# Patient Record
Sex: Female | Born: 1945 | Race: White | Hispanic: No | Marital: Married | Smoking: Current every day smoker
Health system: Southern US, Community
[De-identification: ages and names within clinical notes are randomized; demographics above are authoritative.]

## PROBLEM LIST (undated history)

## (undated) DIAGNOSIS — K219 Gastro-esophageal reflux disease without esophagitis: Secondary | ICD-10-CM

## (undated) DIAGNOSIS — R609 Edema, unspecified: Secondary | ICD-10-CM

## (undated) DIAGNOSIS — E785 Hyperlipidemia, unspecified: Secondary | ICD-10-CM

## (undated) DIAGNOSIS — F419 Anxiety disorder, unspecified: Secondary | ICD-10-CM

## (undated) DIAGNOSIS — K449 Diaphragmatic hernia without obstruction or gangrene: Secondary | ICD-10-CM

## (undated) DIAGNOSIS — K5090 Crohn's disease, unspecified, without complications: Secondary | ICD-10-CM

## (undated) DIAGNOSIS — M5412 Radiculopathy, cervical region: Secondary | ICD-10-CM

## (undated) DIAGNOSIS — I10 Essential (primary) hypertension: Secondary | ICD-10-CM

## (undated) HISTORY — DX: Radiculopathy, cervical region: M54.12

## (undated) HISTORY — DX: Diaphragmatic hernia without obstruction or gangrene: K44.9

## (undated) HISTORY — DX: Gastro-esophageal reflux disease without esophagitis: K21.9

## (undated) HISTORY — PX: BLADDER SURGERY: SHX569

## (undated) HISTORY — PX: BREAST BIOPSY: SHX20

## (undated) HISTORY — PX: HEMORROIDECTOMY: SUR656

## (undated) HISTORY — DX: Hyperlipidemia, unspecified: E78.5

## (undated) HISTORY — PX: OTHER SURGICAL HISTORY: SHX169

## (undated) HISTORY — PX: CHOLECYSTECTOMY: SHX55

## (undated) HISTORY — PX: BACK SURGERY: SHX140

## (undated) HISTORY — DX: Edema, unspecified: R60.9

## (undated) HISTORY — DX: Crohn's disease, unspecified, without complications: K50.90

## (undated) HISTORY — DX: Essential (primary) hypertension: I10

## (undated) HISTORY — PX: TONSILLECTOMY: SUR1361

## (undated) HISTORY — DX: Anxiety disorder, unspecified: F41.9

## (undated) HISTORY — PX: ABDOMINAL HYSTERECTOMY: SHX81

---

## 2008-07-02 HISTORY — PX: COLONOSCOPY: SHX174

## 2012-04-11 HISTORY — PX: ESOPHAGOGASTRODUODENOSCOPY ENDOSCOPY: SHX5814

## 2013-12-09 ENCOUNTER — Encounter: Payer: Self-pay | Admitting: Internal Medicine

## 2013-12-11 ENCOUNTER — Encounter: Payer: Self-pay | Admitting: Internal Medicine

## 2014-01-15 ENCOUNTER — Ambulatory Visit: Payer: Self-pay | Admitting: Nurse Practitioner

## 2014-01-19 ENCOUNTER — Ambulatory Visit: Payer: Self-pay | Admitting: Nurse Practitioner

## 2014-01-22 ENCOUNTER — Other Ambulatory Visit: Payer: Self-pay

## 2014-01-22 ENCOUNTER — Ambulatory Visit (INDEPENDENT_AMBULATORY_CARE_PROVIDER_SITE_OTHER): Payer: BLUE CROSS/BLUE SHIELD | Admitting: Nurse Practitioner

## 2014-01-22 ENCOUNTER — Encounter: Payer: Self-pay | Admitting: Nurse Practitioner

## 2014-01-22 VITALS — BP 127/63 | HR 63 | Temp 98.1°F | Ht 61.0 in | Wt 149.8 lb

## 2014-01-22 DIAGNOSIS — K219 Gastro-esophageal reflux disease without esophagitis: Secondary | ICD-10-CM

## 2014-01-22 DIAGNOSIS — K50919 Crohn's disease, unspecified, with unspecified complications: Secondary | ICD-10-CM

## 2014-01-22 DIAGNOSIS — K5090 Crohn's disease, unspecified, without complications: Secondary | ICD-10-CM | POA: Insufficient documentation

## 2014-01-22 NOTE — Assessment & Plan Note (Signed)
Recurrent epigastric pain/dyspepsia symptoms on PPI and Ranitadine with a history of ulcers. No GI bleeding/melena or other warning/red flag symptoms. Previous endoscopy records requested. Will plan for an EGD to evaluate for recurrent ulcer.  Proceed with EGD with Dr. Jena Gauss in near future: the risks, benefits, and alternatives have been discussed with the patient in detail. The patient states understanding and desires to proceed.

## 2014-01-22 NOTE — Patient Instructions (Addendum)
1. Call us if you develop flare symptoms that you need treated for.. 2. We will schedule your endoscopy for recurrent GERD symptoms on PPI and Ranitadine with a history of ulcers 3. Further recommendations pending the results of the procedure.

## 2014-01-22 NOTE — Progress Notes (Signed)
  Primary Care Physician:  Elliott, Dianne E Primary Gastroenterologist:  Dr. Rourk  Chief Complaint  Patient presents with  . Pruritis    HPI:   68 year old female with a multiyear history of Crohn's colitis without need of treatment. Per previous GI records reviewed, in the past several years began having flare ups with bloody diarrhea, mucus, and purulence. Last colonoscopy in 2010 showed pancolitis and 2013 sigmoidoscopy during a flare showed mucus and friability but no dysplasia. Initially had response to Cimzia but lost response, Tysabri had side effects of edema and fatigue, and Remicade with lupoid reaction. Methotrexate has also caused an itching deemed allergic reaction as well. Was essentially asymptomatic with Entyvio for 6 of the 8 weeks between infusions with occasional Imodium needed.  Current presents for evaluation and continued care. After her third dose of Entyvio she had a lot of itching. Was started on vistaril by her PCP which helps a bit but still has some residual symptoms. Was scheduled for another infusion in early January of this year but the itching was deemed an allergic reaction and they would not proceed. The patient states she didn't want to take any more of it anyway. Typically has multi-day flares every 3-4 months. Has occasional one day flares as well which are generally tolerable with some Imodium but can be bothersome. When she has flares she will have 10-30 bowel movements in a day with onset of eating which are usually non-bloody, with mucus, and orange in color and associated abdominal pain which relieves with the bowel movememnt. Denies N/V. No abdominal pain when she's not having a flar. Last prolonged flare 09/2013. Last "bad day" was 3 days ago, had a normal bowel movement today. Regular non-flare BMs are soft. Also has GERD with symptoms currently controlled on regimen of Prevacid 30 mg daily and Ranitadine 150mg every evening. However she also states she's  been having some epigastric pain and is concerned that her ulcer may be coming back and has been taking Mylanta bid for the past 2 weeks. Epigastric pain is described as sharp which improves with eating.  Per patient she has a history of gastric ulcers but endoscopic records are unavailable right now. She states her last EGD was a couple years ago. Will request those records.  Past Medical History  Diagnosis Date  . GERD (gastroesophageal reflux disease)   . Hiatal hernia   . Hyperlipidemia   . Cervical radiculopathy   . Crohn disease   . Hypertension   . Edema   . Anxiety     Past Surgical History  Procedure Laterality Date  . Tonsillectomy    . Hemorroidectomy    . Back surgery      c5  . Cholecystectomy    . Abdominal hysterectomy    . Bladder surgery    . Esophagogastroduodenoscopy endoscopy  04-11-2012    Dr. Spainhour  . Breast biopsy      benign  . Foraminectomy    . Colonoscopy  07/02/2008    Dr. Spainhour    Current Outpatient Prescriptions  Medication Sig Dispense Refill  . Ascorbic Acid (VITAMIN C) 1000 MG tablet Take 1,000 mg by mouth daily.    . Cholecalciferol (VITAMIN D-3 PO) Take by mouth daily.    . DULoxetine (CYMBALTA) 30 MG capsule Take 30 mg by mouth daily.     . lansoprazole (PREVACID) 30 MG capsule Take 30 mg by mouth daily at 12 noon.     . meloxicam (  MOBIC) 15 MG tablet Take 15 mg by mouth daily.    . MetFORMIN HCl (GLUCOPHAGE PO) Take 250 mg by mouth daily.    . MINIVELLE 0.05 MG/24HR patch Place 1 patch onto the skin 2 (two) times a week.     . Multiple Vitamin (MULTIVITAMIN) capsule Take 1 capsule by mouth daily.    . Omega-3 Fatty Acids (OMEGA-3 FISH OIL PO) Take by mouth.    . ranitidine (ZANTAC) 150 MG capsule Take 150 mg by mouth every evening.    . Red Yeast Rice 600 MG CAPS Take 600 mg by mouth 2 (two) times daily.    . traMADol (ULTRAM) 50 MG tablet Take 50 mg by mouth every 6 (six) hours as needed.    . TURMERIC PO Take 505 mg by mouth 2  (two) times daily.    . vitamin B-12 (CYANOCOBALAMIN) 1000 MCG tablet Take 1,000 mcg by mouth daily.    . zolpidem (AMBIEN) 10 MG tablet Take 10 mg by mouth at bedtime as needed.   2   No current facility-administered medications for this visit.    Allergies as of 01/22/2014 - never reviewed  Allergen Reaction Noted  . Sulfa antibiotics Anaphylaxis 01/22/2014  . Methotrexate derivatives Itching 01/22/2014  . Remicade [infliximab]  01/22/2014    Family History  Problem Relation Age of Onset  . Colon cancer Neg Hx   . Dementia Mother   . Lung cancer Father     History   Social History  . Marital Status: Unknown    Spouse Name: N/A    Number of Children: N/A  . Years of Education: N/A   Occupational History  . Not on file.   Social History Main Topics  . Smoking status: Current Every Day Smoker -- 0.50 packs/day    Types: Cigarettes  . Smokeless tobacco: Not on file  . Alcohol Use: No  . Drug Use: No  . Sexual Activity: Not on file   Other Topics Concern  . Not on file   Social History Narrative    Review of Systems: Gen: Denies any fever, chills, fatigue, weight loss, lack of appetite.  CV: Denies chest pain, heart palpitations, peripheral edema, syncope.  Resp: Denies shortness of breath at rest or with exertion. Denies wheezing or cough.  GI: See HPI. Denies dysphagia or odynophagia. Denies jaundice, hematemesis. MS: Denies joint pain, muscle weakness, cramps, or limitation of movement.  Derm: Denies rash, itching, dry skin Psych: Denies depression, anxiety, memory loss, and confusion Heme: Denies bruising, bleeding, and enlarged lymph nodes.  Physical Exam: BP 127/63 mmHg  Pulse 63  Temp(Src) 98.1 F (36.7 C) (Oral)  Ht 5' 1" (1.549 m)  Wt 149 lb 12.8 oz (67.949 kg)  BMI 28.32 kg/m2 General:   Alert and oriented. Pleasant and cooperative. Well-nourished and well-developed.  Head:  Normocephalic and atraumatic. Eyes:  Without icterus, sclera clear and  conjunctiva pink.  Ears:  Normal auditory acuity. Mouth:  No deformity or lesions, oral mucosa pink. No OP edema. Neck:  Supple, without mass or thyromegaly. Lungs:  Clear to auscultation bilaterally. No wheezes, rales, or rhonchi. No distress.  Heart:  S1, S2 present without murmurs appreciated.  Abdomen:  +BS, soft, and non-distended. Mild TTP lower abdomen and epigastric area. No HSM noted. No guarding or rebound. No masses appreciated.  Rectal:  Deferred  Msk:  Symmetrical without gross deformities. Normal posture. Pulses:  Normal DP pulses noted. Extremities:  Without clubbing or edema. Neurologic:  Alert   and  oriented x4;  grossly normal neurologically. Skin:  Intact without significant lesions or rashes. Cervical Nodes:  No significant cervical adenopathy. Psych:  Alert and cooperative. Normal mood and affect.     01/23/2014 4:23 PM  

## 2014-01-22 NOTE — Assessment & Plan Note (Addendum)
Longstanding Crohn's disease which has failed multiple treatments due to lost effect or patient reaction. Occasional limited mild flares well controlled with Immodium. Moderate flares about every 4 months. Will not add any new medications at this time, if has a recurrent moderate to severe flare can treat with steroids and map out her Crohn's pattern off additional therapies. Last colonoscopy in 2013 with pancolitis per previous records, no mention of polyps but will request further records to be certain and to plan for need of next colonoscopy. Will discuss further with Dr. Jena Gauss for any additional recommendations.  After discussion with Dr. Jena Gauss, can consider starting mesalamine for maintenance of remission. Will call patient and inquire if she's ever tried this and if she'd be interested in trying. If so will check baseline BMP for renal function and repeat yearly.  Immuran is another possibility. When we receive records from previous GI can see if there's any sign of ileal disease which would increase her risk for stricture and recommend further evaluation based on those records.

## 2014-01-23 ENCOUNTER — Telehealth: Payer: Self-pay | Admitting: Nurse Practitioner

## 2014-01-23 ENCOUNTER — Encounter: Payer: Self-pay | Admitting: Nurse Practitioner

## 2014-01-23 NOTE — Telephone Encounter (Signed)
Please call the patient and ask if she's ever taken mesalamine (Pentasa) for her Crohn's disease and if she'd be interested in trying it as a maintenance medication. If so we'd need to get a baseline BMP on her for renal function and recheck it yearly.

## 2014-01-26 ENCOUNTER — Ambulatory Visit: Payer: Self-pay | Admitting: Nurse Practitioner

## 2014-01-27 NOTE — Telephone Encounter (Signed)
Tried to call [name] 

## 2014-01-28 NOTE — Progress Notes (Signed)
cc'ed to pcp °

## 2014-01-29 ENCOUNTER — Telehealth: Payer: Self-pay | Admitting: Nurse Practitioner

## 2014-01-29 NOTE — Telephone Encounter (Signed)
Patient states she's allergic to Asacol which is the same med as Pentasa (Mesalamine). Continue with plan as we discussed at her visit. Please update patient.

## 2014-01-29 NOTE — Telephone Encounter (Signed)
I have not put in orders for BMP. Pt will wait until it is decided if she will be put on the medication or not after she see's RMR. Pt is aware of this.

## 2014-01-29 NOTE — Telephone Encounter (Signed)
I added asacol to her allergy list.

## 2014-01-29 NOTE — Telephone Encounter (Signed)
I'll send in the Rx. Thanks

## 2014-01-29 NOTE — Telephone Encounter (Signed)
See other phone note

## 2014-01-29 NOTE — Telephone Encounter (Signed)
Pt called back- she said she was on asachol several years ago and it causes severe itching. She is willing to try pentasa. She uses [facility] in [place]. She said Sunday she started having nausea after she ate lunch and had diarrhea 8 times in one hour. Today she had one episode of loose stool and yesterday she had formed stool. She is not sure if its her crohns or if its a virus. She is aware of the blood work and to have it done prior to starting pentasa. She asked that I mail the order to her.

## 2014-01-29 NOTE — Telephone Encounter (Signed)
Pt is aware. She said she may be willing to try it anyway if there were no other alternatives. She is fine to wait until after her procedures to discuss it.

## 2014-02-02 NOTE — Telephone Encounter (Signed)
Noted  

## 2014-02-11 ENCOUNTER — Encounter (HOSPITAL_COMMUNITY): Payer: Self-pay | Admitting: Emergency Medicine

## 2014-02-11 ENCOUNTER — Telehealth: Payer: Self-pay | Admitting: Nurse Practitioner

## 2014-02-11 ENCOUNTER — Ambulatory Visit (HOSPITAL_COMMUNITY)
Admission: RE | Admit: 2014-02-11 | Discharge: 2014-02-11 | Disposition: A | Discharge status: Home / Self Care | Payer: BLUE CROSS/BLUE SHIELD | Source: Ambulatory Visit | Attending: Internal Medicine | Admitting: Internal Medicine

## 2014-02-11 ENCOUNTER — Encounter (HOSPITAL_COMMUNITY)
Admission: RE | Disposition: A | Discharge status: Home / Self Care | Payer: Self-pay | Source: Ambulatory Visit | Attending: Internal Medicine

## 2014-02-11 ENCOUNTER — Other Ambulatory Visit: Payer: Self-pay

## 2014-02-11 DIAGNOSIS — E785 Hyperlipidemia, unspecified: Secondary | ICD-10-CM | POA: Insufficient documentation

## 2014-02-11 DIAGNOSIS — R1013 Epigastric pain: Principal | ICD-10-CM

## 2014-02-11 DIAGNOSIS — Z79899 Other long term (current) drug therapy: Secondary | ICD-10-CM | POA: Diagnosis not present

## 2014-02-11 DIAGNOSIS — Z9889 Other specified postprocedural states: Secondary | ICD-10-CM | POA: Diagnosis not present

## 2014-02-11 DIAGNOSIS — F17210 Nicotine dependence, cigarettes, uncomplicated: Secondary | ICD-10-CM | POA: Insufficient documentation

## 2014-02-11 DIAGNOSIS — K219 Gastro-esophageal reflux disease without esophagitis: Secondary | ICD-10-CM | POA: Diagnosis present

## 2014-02-11 DIAGNOSIS — K30 Functional dyspepsia: Secondary | ICD-10-CM

## 2014-02-11 DIAGNOSIS — I10 Essential (primary) hypertension: Secondary | ICD-10-CM | POA: Diagnosis not present

## 2014-02-11 DIAGNOSIS — Z791 Long term (current) use of non-steroidal anti-inflammatories (NSAID): Secondary | ICD-10-CM | POA: Diagnosis not present

## 2014-02-11 DIAGNOSIS — G8929 Other chronic pain: Secondary | ICD-10-CM

## 2014-02-11 DIAGNOSIS — K2970 Gastritis, unspecified, without bleeding: Secondary | ICD-10-CM

## 2014-02-11 HISTORY — PX: ESOPHAGOGASTRODUODENOSCOPY: SHX5428

## 2014-02-11 LAB — HEPATIC FUNCTION PANEL
ALT: 19 U/L (ref 0–35)
AST: 18 U/L (ref 0–37)
Albumin: 3.4 g/dL — ABNORMAL LOW (ref 3.5–5.2)
Alkaline Phosphatase: 55 U/L (ref 39–117)
BILIRUBIN DIRECT: 0.1 mg/dL (ref 0.0–0.5)
BILIRUBIN TOTAL: 0.7 mg/dL (ref 0.3–1.2)
Indirect Bilirubin: 0.6 mg/dL (ref 0.3–0.9)
TOTAL PROTEIN: 6.5 g/dL (ref 6.0–8.3)

## 2014-02-11 LAB — CBC WITH DIFFERENTIAL/PLATELET
Basophils Absolute: 0 10*3/uL (ref 0.0–0.1)
Basophils Relative: 0 % (ref 0–1)
EOS PCT: 2 % (ref 0–5)
Eosinophils Absolute: 0.2 10*3/uL (ref 0.0–0.7)
HCT: 37.6 % (ref 36.0–46.0)
Hemoglobin: 12.2 g/dL (ref 12.0–15.0)
LYMPHS PCT: 44 % (ref 12–46)
Lymphs Abs: 3.6 10*3/uL (ref 0.7–4.0)
MCH: 31.2 pg (ref 26.0–34.0)
MCHC: 32.4 g/dL (ref 30.0–36.0)
MCV: 96.2 fL (ref 78.0–100.0)
MONO ABS: 0.5 10*3/uL (ref 0.1–1.0)
MONOS PCT: 6 % (ref 3–12)
NEUTROS ABS: 4 10*3/uL (ref 1.7–7.7)
Neutrophils Relative %: 48 % (ref 43–77)
PLATELETS: 260 10*3/uL (ref 150–400)
RBC: 3.91 MIL/uL (ref 3.87–5.11)
RDW: 13.1 % (ref 11.5–15.5)
WBC: 8.3 10*3/uL (ref 4.0–10.5)

## 2014-02-11 LAB — BASIC METABOLIC PANEL
ANION GAP: 6 (ref 5–15)
BUN: 13 mg/dL (ref 6–23)
CALCIUM: 8.5 mg/dL (ref 8.4–10.5)
CHLORIDE: 106 mmol/L (ref 96–112)
CO2: 27 mmol/L (ref 19–32)
CREATININE: 0.64 mg/dL (ref 0.50–1.10)
GFR calc non Af Amer: 89 mL/min — ABNORMAL LOW (ref >90)
Glucose, Bld: 99 mg/dL (ref 70–99)
POTASSIUM: 3.9 mmol/L (ref 3.5–5.1)
Sodium: 139 mmol/L (ref 135–145)

## 2014-02-11 LAB — LIPASE, BLOOD: LIPASE: 23 U/L (ref 11–59)

## 2014-02-11 LAB — GLUCOSE, CAPILLARY: Glucose-Capillary: 102 mg/dL — ABNORMAL HIGH (ref 70–99)

## 2014-02-11 SURGERY — EGD (ESOPHAGOGASTRODUODENOSCOPY)
Anesthesia: Moderate Sedation

## 2014-02-11 MED ORDER — ONDANSETRON HCL 4 MG/2ML IJ SOLN
INTRAMUSCULAR | Status: DC | PRN
  Administered 2014-02-11: 4 mg via INTRAVENOUS

## 2014-02-11 MED ORDER — SODIUM CHLORIDE 0.9 % IV SOLN
INTRAVENOUS | Status: DC
  Administered 2014-02-11: 07:00:00 via INTRAVENOUS

## 2014-02-11 MED ORDER — ONDANSETRON HCL 4 MG/2ML IJ SOLN
INTRAMUSCULAR | Status: AC
  Filled 2014-02-11: qty 2

## 2014-02-11 MED ORDER — MIDAZOLAM HCL 5 MG/5ML IJ SOLN
INTRAMUSCULAR | Status: DC | PRN
  Administered 2014-02-11 (×2): 2 mg via INTRAVENOUS
  Administered 2014-02-11: 1 mg via INTRAVENOUS

## 2014-02-11 MED ORDER — MIDAZOLAM HCL 5 MG/5ML IJ SOLN
INTRAMUSCULAR | Status: AC
  Filled 2014-02-11: qty 10

## 2014-02-11 MED ORDER — SIMETHICONE 40 MG/0.6ML PO SUSP
ORAL | Status: DC | PRN
  Administered 2014-02-11: 08:00:00

## 2014-02-11 MED ORDER — MEPERIDINE HCL 100 MG/ML IJ SOLN
INTRAMUSCULAR | Status: AC
  Filled 2014-02-11: qty 2

## 2014-02-11 MED ORDER — LIDOCAINE VISCOUS 2 % MT SOLN
OROMUCOSAL | Status: DC | PRN
Start: 2014-02-11 — End: 2014-02-11
  Administered 2014-02-11: 2 mL via OROMUCOSAL

## 2014-02-11 MED ORDER — MEPERIDINE HCL 100 MG/ML IJ SOLN
INTRAMUSCULAR | Status: DC | PRN
  Administered 2014-02-11: 50 mg via INTRAVENOUS
  Administered 2014-02-11: 25 mg via INTRAVENOUS

## 2014-02-11 MED ORDER — LIDOCAINE VISCOUS 2 % MT SOLN
OROMUCOSAL | Status: AC
  Filled 2014-02-11: qty 15

## 2014-02-11 NOTE — Discharge Instructions (Signed)
EGD Discharge instructions Please read the instructions outlined below and refer to this sheet in the next few weeks. These discharge instructions provide you with general information on caring for yourself after you leave the hospital. Your doctor may also give you specific instructions. While your treatment has been planned according to the most current medical practices available, unavoidable complications occasionally occur. If you have any problems or questions after discharge, please call your doctor. ACTIVITY  You may resume your regular activity but move at a slower pace for the next 24 hours.   Take frequent rest periods for the next 24 hours.   Walking will help expel (get rid of) the air and reduce the bloated feeling in your abdomen.   No driving for 24 hours (because of the anesthesia (medicine) used during the test).   You may shower.   Do not sign any important legal documents or operate any machinery for 24 hours (because of the anesthesia used during the test).  NUTRITION  Drink plenty of fluids.   You may resume your normal diet.   Begin with a light meal and progress to your normal diet.   Avoid alcoholic beverages for 24 hours or as instructed by your caregiver.  MEDICATIONS  You may resume your normal medications unless your caregiver tells you otherwise.  WHAT YOU CAN EXPECT TODAY  You may experience abdominal discomfort such as a feeling of fullness or gas pains.  FOLLOW-UP  Your doctor will discuss the results of your test with you.  SEEK IMMEDIATE MEDICAL ATTENTION IF ANY OF THE FOLLOWING OCCUR:  Excessive nausea (feeling sick to your stomach) and/or vomiting.   Severe abdominal pain and distention (swelling).   Trouble swallowing.   Temperature over 101 F (37.8 C).   Rectal bleeding or vomiting of blood.     Stop Prevacid; begin Zegerid 40 mg daily for 1 month and see if this helps with her symptoms  CBC, BMET, LFTs serum lipase  today  Contrast CT of the abdomen and pelvis to further evaluate epigastric pain  As discussed with [name], see Dr. Arlyce Dice about having a stress test just cover all bases.  Office visit with [place] in 4-6 weeks.

## 2014-02-11 NOTE — Telephone Encounter (Signed)
Pt is set up for CT on 02/19/14 @ 10:00 and she is aware

## 2014-02-11 NOTE — Op Note (Signed)
[facility] [address] [place], [address]   ENDOSCOPY PROCEDURE REPORT  PATIENT: Whitney Chapman, Whitney Chapman  MR#: [id number] BIRTHDATE: Jun 09, 1945 , 68  yrs. old GENDER: female ENDOSCOPIST: R.  Roetta Sessions, MD [facility] REFERRED BY:     Dr. Arlyce Dice, Eliott PROCEDURE DATE:  02/24/14 PROCEDURE:  EGD, diagnostic INDICATIONS:  Refractory GERD/dyspepsia. MEDICATIONS: Versed 5 mg IV and Demerol 75 mg IV in divided doses. Zofran 4 mg IV.  Xylocaine gel orally ASA CLASS:      Class II  CONSENT: The risks, benefits, limitations, alternatives and imponderables have been discussed.  The potential for biopsy, esophogeal dilation, etc. have also been reviewed.  Questions have been answered.  All parties agreeable.  Please see the history and physical in the medical record for more information.  DESCRIPTION OF PROCEDURE: After the risks benefits and alternatives of the procedure were thoroughly explained, informed consent was obtained.  The EG-[id number] ([id number]) endoscope was introduced through the mouth and advanced to the second portion of the duodenum , limited by Without limitations. The instrument was slowly withdrawn as the mucosa was fully examined.    Entirely normal-appearing tubular esophagus.  Stomach empty.  Normal gastric mucosa.  No hiatal hernia.  Patent pylorus.  Normal first and second portion of the duodenum.  Incidentally, ampulla seen well it also appeared normal.  Retroflexed views revealed no abnormalities.     The scope was then withdrawn from the patient and the procedure completed.  COMPLICATIONS: There were no immediate complications.  ENDOSCOPIC IMPRESSION: Normal EGD. Symptoms out of proportion to endoscopic findings.   RECOMMENDATIONS: Proceed with abdominal pelvic CT. serum lipase, LFTs, metabolic profile and CBC today. Patient should have a stress test to cover all the bases. Discussed with her husband.  1 month trial of Zegerid 40 mg daily  to see if this makes a difference in her symptoms (hold Prevacid)  Office visit with [place] in 4-6 weeks. We'll revisit the idea of mesalamine therapy to manage her Crohn's colitis. Would consider beginning low dose Lialda i.e. 2.4 g daily and cautiously moving up or with dosing( Issues with Asacol previously nonspecific). This would be a much simpler regimen if tolerated and effective rather then revisiting immunosuppressive/biologic agents.  REPEAT EXAM:  eSigned:  R. Roetta Sessions, MD Jerrel Ivory [facility] Feb 24, 2014 8:42 AM    CC:  CPT CODES: ICD CODES:  The ICD and CPT codes recommended by this software are interpretations from the data that the clinical staff has captured with the software.  The verification of the translation of this report to the ICD and CPT codes and modifiers is the sole responsibility of the health care institution and practicing physician where this report was generated.  [employer] will not be held responsible for the validity of the ICD and CPT codes included on this report.  AMA assumes no liability for data contained or not contained herein. CPT is a Publishing rights manager of the [employer].  PATIENT NAME:  Whitney Chapman, Whitney Chapman MR#: [id number]

## 2014-02-11 NOTE — Telephone Encounter (Signed)
Provided peer to peer per request from insurance, relayed all relevant and updated clinical information. Per the physician at the health plan will only approve CT abdomen (rather than CT abdomen & pelvis). Order number [id number] good through 03/12/14.

## 2014-02-11 NOTE — Interval H&P Note (Signed)
History and Physical Interval Note:  02/11/2014 7:47 AM  Whitney Chapman  has presented today for surgery, with the diagnosis of recurrent GERD  The various methods of treatment have been discussed with the patient and family. After consideration of risks, benefits and other options for treatment, the patient has consented to  Procedure(s) with comments: ESOPHAGOGASTRODUODENOSCOPY (EGD) (N/A) - 730am as a surgical intervention .  The patient's history has been reviewed, patient examined, no change in status, stable for surgery.  I have reviewed the patient's chart and labs.  Questions were answered to the patient's satisfaction.     Robert Rourk  No change. EGD today per plan.The risks, benefits, limitations, alternatives and imponderables have been reviewed with the patient. Potential for esophageal dilation, biopsy, etc. have also been reviewed.  Questions have been answered. All parties agreeable.

## 2014-02-11 NOTE — H&P (View-Only) (Signed)
Primary Care Physician:  Garald Braver Primary Gastroenterologist:  Dr. Jena Gauss  Chief Complaint  Patient presents with  . Pruritis    HPI:   69 year old female with a multiyear history of Crohn's colitis without need of treatment. Per previous GI records reviewed, in the past several years began having flare ups with bloody diarrhea, mucus, and purulence. Last colonoscopy in 2010 showed pancolitis and 2013 sigmoidoscopy during a flare showed mucus and friability but no dysplasia. Initially had response to Cimzia but lost response, Tysabri had side effects of edema and fatigue, and Remicade with lupoid reaction. Methotrexate has also caused an itching deemed allergic reaction as well. Was essentially asymptomatic with Entyvio for 6 of the 8 weeks between infusions with occasional Imodium needed.  Current presents for evaluation and continued care. After her third dose of Entyvio she had a lot of itching. Was started on vistaril by her PCP which helps a bit but still has some residual symptoms. Was scheduled for another infusion in early January of this year but the itching was deemed an allergic reaction and they would not proceed. The patient states she didn't want to take any more of it anyway. Typically has multi-day flares every 3-4 months. Has occasional one day flares as well which are generally tolerable with some Imodium but can be bothersome. When she has flares she will have 10-30 bowel movements in a day with onset of eating which are usually non-bloody, with mucus, and orange in color and associated abdominal pain which relieves with the bowel movememnt. Denies N/V. No abdominal pain when she's not having a flar. Last prolonged flare 09/2013. Last "bad day" was 3 days ago, had a normal bowel movement today. Regular non-flare BMs are soft. Also has GERD with symptoms currently controlled on regimen of Prevacid 30 mg daily and Ranitadine  every evening. However she also states she's  been having some epigastric pain and is concerned that her ulcer may be coming back and has been taking Mylanta bid for the past 2 weeks. Epigastric pain is described as sharp which improves with eating.  Per patient she has a history of gastric ulcers but endoscopic records are unavailable right now. She states her last EGD was a couple years ago. Will request those records.  Past Medical History  Diagnosis Date  . GERD (gastroesophageal reflux disease)   . Hiatal hernia   . Hyperlipidemia   . Cervical radiculopathy   . Crohn disease   . Hypertension   . Edema   . Anxiety     Past Surgical History  Procedure Laterality Date  . Tonsillectomy    . Hemorroidectomy    . Back surgery      c5  . Cholecystectomy    . Abdominal hysterectomy    . Bladder surgery    . Esophagogastroduodenoscopy endoscopy  04-11-2012    Dr. Aleene Davidson  . Breast biopsy      benign  . Foraminectomy    . Colonoscopy  07/02/2008    Dr. Aleene Davidson    Current Outpatient Prescriptions  Medication Sig Dispense Refill  . Ascorbic Acid (VITAMIN C) 1000 MG tablet Take 1,000 mg by mouth daily.    . Cholecalciferol (VITAMIN D-3 PO) Take by mouth daily.    . DULoxetine (CYMBALTA) 30 MG capsule Take 30 mg by mouth daily.     . lansoprazole (PREVACID) 30 MG capsule Take 30 mg by mouth daily at 12 noon.     . meloxicam (  MOBIC) 15 MG tablet Take 15 mg by mouth daily.    . MetFORMIN HCl (GLUCOPHAGE PO) Take 250 mg by mouth daily.    Marland Kitchen MINIVELLE 0.05 MG/24HR patch Place 1 patch onto the skin 2 (two) times a week.     . Multiple Vitamin (MULTIVITAMIN) capsule Take 1 capsule by mouth daily.    . Omega-3 Fatty Acids (OMEGA-3 FISH OIL PO) Take by mouth.    . ranitidine (ZANTAC) 150 MG capsule Take 150 mg by mouth every evening.    . Red Yeast Rice 600 MG CAPS Take 600 mg by mouth 2 (two) times daily.    . traMADol (ULTRAM) 50 MG tablet Take 50 mg by mouth every 6 (six) hours as needed.    . TURMERIC PO Take 505 mg by mouth 2  (two) times daily.    . vitamin B-12 (CYANOCOBALAMIN) 1000 MCG tablet Take 1,000 mcg by mouth daily.    Marland Kitchen zolpidem (AMBIEN) 10 MG tablet Take 10 mg by mouth at bedtime as needed.   2   No current facility-administered medications for this visit.    Allergies as of 01/22/2014 - never reviewed  Allergen Reaction Noted  . Sulfa antibiotics Anaphylaxis 01/22/2014  . Methotrexate derivatives Itching 01/22/2014  . Remicade [infliximab]  01/22/2014    Family History  Problem Relation Age of Onset  . Colon cancer Neg Hx   . Dementia Mother   . Lung cancer Father     History   Social History  . Marital Status: Unknown    Spouse Name: N/A    Number of Children: N/A  . Years of Education: N/A   Occupational History  . Not on file.   Social History Main Topics  . Smoking status: Current Every Day Smoker -- 0.50 packs/day    Types: Cigarettes  . Smokeless tobacco: Not on file  . Alcohol Use: No  . Drug Use: No  . Sexual Activity: Not on file   Other Topics Concern  . Not on file   Social History Narrative    Review of Systems: Gen: Denies any fever, chills, fatigue, weight loss, lack of appetite.  CV: Denies chest pain, heart palpitations, peripheral edema, syncope.  Resp: Denies shortness of breath at rest or with exertion. Denies wheezing or cough.  GI: See HPI. Denies dysphagia or odynophagia. Denies jaundice, hematemesis. MS: Denies joint pain, muscle weakness, cramps, or limitation of movement.  Derm: Denies rash, itching, dry skin Psych: Denies depression, anxiety, memory loss, and confusion Heme: Denies bruising, bleeding, and enlarged lymph nodes.  Physical Exam: BP 127/63 mmHg  Pulse 63  Temp(Src) 98.1 F (36.7 C) (Oral)  Ht 5\' 1"  (1.549 m)  Wt 149 lb 12.8 oz (67.949 kg)  BMI 28.32 kg/m2 General:   Alert and oriented. Pleasant and cooperative. Well-nourished and well-developed.  Head:  Normocephalic and atraumatic. Eyes:  Without icterus, sclera clear and  conjunctiva pink.  Ears:  Normal auditory acuity. Mouth:  No deformity or lesions, oral mucosa pink. No OP edema. Neck:  Supple, without mass or thyromegaly. Lungs:  Clear to auscultation bilaterally. No wheezes, rales, or rhonchi. No distress.  Heart:  S1, S2 present without murmurs appreciated.  Abdomen:  +BS, soft, and non-distended. Mild TTP lower abdomen and epigastric area. No HSM noted. No guarding or rebound. No masses appreciated.  Rectal:  Deferred  Msk:  Symmetrical without gross deformities. Normal posture. Pulses:  Normal DP pulses noted. Extremities:  Without clubbing or edema. Neurologic:  Alert  and  oriented x4;  grossly normal neurologically. Skin:  Intact without significant lesions or rashes. Cervical Nodes:  No significant cervical adenopathy. Psych:  Alert and cooperative. Normal mood and affect.     01/23/2014 4:23 PM

## 2014-02-13 ENCOUNTER — Encounter (HOSPITAL_COMMUNITY): Payer: Self-pay | Admitting: Internal Medicine

## 2014-02-19 ENCOUNTER — Ambulatory Visit (HOSPITAL_COMMUNITY)
Admission: RE | Admit: 2014-02-19 | Discharge: 2014-02-19 | Disposition: A | Discharge status: Home / Self Care | Payer: BLUE CROSS/BLUE SHIELD | Source: Ambulatory Visit | Attending: Internal Medicine | Admitting: Internal Medicine

## 2014-02-19 DIAGNOSIS — K5090 Crohn's disease, unspecified, without complications: Secondary | ICD-10-CM | POA: Diagnosis not present

## 2014-02-19 DIAGNOSIS — R918 Other nonspecific abnormal finding of lung field: Secondary | ICD-10-CM | POA: Insufficient documentation

## 2014-02-19 DIAGNOSIS — R1013 Epigastric pain: Secondary | ICD-10-CM

## 2014-02-19 DIAGNOSIS — Z9049 Acquired absence of other specified parts of digestive tract: Secondary | ICD-10-CM | POA: Insufficient documentation

## 2014-02-19 DIAGNOSIS — N281 Cyst of kidney, acquired: Secondary | ICD-10-CM | POA: Insufficient documentation

## 2014-02-19 DIAGNOSIS — R109 Unspecified abdominal pain: Secondary | ICD-10-CM | POA: Diagnosis present

## 2014-02-19 DIAGNOSIS — G8929 Other chronic pain: Secondary | ICD-10-CM

## 2014-02-19 MED ORDER — IOHEXOL 300 MG/ML  SOLN
100.0000 mL | Freq: Once | INTRAMUSCULAR | Status: AC | PRN
  Administered 2014-02-19: 100 mL via INTRAVENOUS

## 2014-03-04 ENCOUNTER — Other Ambulatory Visit: Payer: Self-pay | Admitting: Nurse Practitioner

## 2014-03-04 DIAGNOSIS — R1033 Periumbilical pain: Secondary | ICD-10-CM

## 2014-03-04 DIAGNOSIS — Z8719 Personal history of other diseases of the digestive system: Secondary | ICD-10-CM

## 2014-03-05 NOTE — Progress Notes (Signed)
See other phone note

## 2014-03-05 NOTE — Progress Notes (Signed)
Whitney Chapman is working on this.

## 2014-03-11 ENCOUNTER — Encounter: Payer: Self-pay | Admitting: Nurse Practitioner

## 2014-03-11 ENCOUNTER — Ambulatory Visit (INDEPENDENT_AMBULATORY_CARE_PROVIDER_SITE_OTHER): Payer: BLUE CROSS/BLUE SHIELD | Admitting: Nurse Practitioner

## 2014-03-11 VITALS — BP 131/66 | HR 66 | Temp 97.3°F | Ht 61.0 in | Wt 152.8 lb

## 2014-03-11 DIAGNOSIS — K50919 Crohn's disease, unspecified, with unspecified complications: Secondary | ICD-10-CM

## 2014-03-11 DIAGNOSIS — K219 Gastro-esophageal reflux disease without esophagitis: Secondary | ICD-10-CM

## 2014-03-11 MED ORDER — PANTOPRAZOLE SODIUM 40 MG PO TBEC: 40.0000 mg | DELAYED_RELEASE_TABLET | Freq: Every day | ORAL | Status: DC

## 2014-03-11 NOTE — Patient Instructions (Addendum)
1. I sent in an prescription for Protonix to try. Stop taking the Prevacid for now. 2. Have your CT abdomen/pelvis done (the new order) 3. Have your lab drawn. When we get the results of your lab and CT we can consider starting Lialda to control your Crohn's disease. 4. Return for follow-up in 3 months.

## 2014-03-11 NOTE — Progress Notes (Signed)
Referring Provider: Romeo Rabon, MD Primary Care Physician:  Romeo Rabon, MD Primary GI:  Dr. Jena Gauss  Chief Complaint  Patient presents with  . Follow-up    HPI:   69 year old female presents for follow-up on EGD for refractory GERD. CT A&P initially ordered by insurance would only approve CT Abdomen which was done. Re-requested pelvic immaging due to history of Crohn's and was approved 03/04/14, patient has not completed yet.   Today she states she's doing pretty good lately. Has had one episode of abdominal pain last week for about 1 day and some transient minimal amount of diarrhea which has been tolerated pretty well. She wanted to check with [place] about doing the more imaging. Is also amendable to trying mesalamine in the form of Lialda 2.4g daily for remission. Denies hematochezia or melena. Has occasional nausea with the abdominal pain which, again, has been much improved lately. Denies vomiting. Denies fever and chills, unintended weight loss, chest pain, or worsening shortness of breath. She has started smoking again and does want to quit because of the social stigma. Is still having some significant GERD symptoms, was recommended to trial a 1 month trial of Zegrid 40 mg which she did but stopped it due to LE edema. Is currently on Prevacid and Zantac. Denies any other upper or lower GI symptoms.    Past Medical History  Diagnosis Date  . GERD (gastroesophageal reflux disease)   . Hiatal hernia   . Hyperlipidemia   . Cervical radiculopathy   . Crohn disease   . Hypertension   . Edema   . Anxiety     Past Surgical History  Procedure Laterality Date  . Tonsillectomy    . Hemorroidectomy    . Back surgery      c5  . Cholecystectomy    . Abdominal hysterectomy    . Bladder surgery    . Esophagogastroduodenoscopy endoscopy  04-11-2012    Dr. Aleene Davidson  . Breast biopsy      benign  . Foraminectomy    . Colonoscopy  07/02/2008    Dr. Aleene Davidson  .  Esophagogastroduodenoscopy N/A 02/11/2014    RMR: normal EGD    Current Outpatient Prescriptions  Medication Sig Dispense Refill  . acyclovir (ZOVIRAX) 200 MG capsule Take 5 capsules by mouth every 3 (three) hours as needed (leg infection).     . Ascorbic Acid (VITAMIN C) 1000 MG tablet Take 1,000 mg by mouth daily.    . B Complex-Folic Acid (B COMPLEX-VITAMIN B12 PO) Take 1 tablet by mouth daily.    . Cholecalciferol (VITAMIN D-3 PO) Take by mouth daily.    . DULoxetine (CYMBALTA) 30 MG capsule Take 30 mg by mouth daily.     . hydrOXYzine (ATARAX/VISTARIL) 25 MG tablet Take 1 tablet by mouth daily as needed for itching.     . lansoprazole (PREVACID) 30 MG capsule Take 30 mg by mouth daily at 12 noon.     Marland Kitchen LORazepam (ATIVAN) 1 MG tablet Take 1 tablet by mouth daily as needed for sleep.     . MetFORMIN HCl (GLUCOPHAGE PO) Take 250 mg by mouth daily.    Marland Kitchen MINIVELLE 0.05 MG/24HR patch Place 1 patch onto the skin 2 (two) times a week.     . Multiple Vitamin (MULTIVITAMIN) capsule Take 1 capsule by mouth daily.    . Omega-3 Fatty Acids (OMEGA-3 FISH OIL PO) Take by mouth.    . ranitidine (ZANTAC) 150 MG capsule Take 150 mg  by mouth every evening.    . Red Yeast Rice 600 MG CAPS Take 600 mg by mouth 2 (two) times daily.    . traMADol (ULTRAM) 50 MG tablet Take 50 mg by mouth every 6 (six) hours as needed for moderate pain.     . TURMERIC PO Take 505 mg by mouth 2 (two) times daily.    . vitamin B-12 (CYANOCOBALAMIN) 1000 MCG tablet Take 1,000 mcg by mouth daily.    Marland Kitchen zolpidem (AMBIEN) 10 MG tablet Take 10 mg by mouth at bedtime as needed for sleep.   2  . meloxicam (MOBIC) 15 MG tablet Take 15 mg by mouth daily as needed for pain.      No current facility-administered medications for this visit.    Allergies as of 03/11/2014 - Review Complete 03/11/2014  Allergen Reaction Noted  . Sulfa antibiotics Anaphylaxis 01/22/2014  . Asacol [mesalamine] Itching 01/29/2014  . Methotrexate derivatives  Itching 01/22/2014  . Remicade [infliximab] Other (See Comments) 01/22/2014  . Trazodone and nefazodone Other (See Comments) 03/11/2014    Family History  Problem Relation Age of Onset  . Colon cancer Neg Hx   . Dementia Mother   . Lung cancer Father     History   Social History  . Marital Status: Married    Spouse Name: N/A  . Number of Children: N/A  . Years of Education: N/A   Social History Main Topics  . Smoking status: Current Every Day Smoker -- 0.50 packs/day    Types: Cigarettes  . Smokeless tobacco: Not on file  . Alcohol Use: No  . Drug Use: No  . Sexual Activity: Not on file   Other Topics Concern  . None   Social History Narrative    Review of Systems: Gen: Denies fever, chills, anorexia. Denies fatigue, weakness, weight loss.  CV: Denies chest pain, palpitations, syncope, peripheral edema, and claudication. Resp: Denies dyspnea at rest, cough, wheezing, coughing up blood, and pleurisy. GI: Denies vomiting blood, jaundice, and fecal incontinence.   Denies dysphagia or odynophagia. Derm: Denies rash, itching, dry skin Psych: Denies depression, anxiety, memory loss, confusion. No homicidal or suicidal ideation.  Heme: Denies bruising, bleeding, and enlarged lymph nodes.  Physical Exam: BP 131/66 mmHg  Pulse 66  Temp(Src) 97.3 F (36.3 C)  Ht  (1.549 m)  Wt 152 lb 12.8 oz (69.31 kg)  BMI 28.89 kg/m2 General:   Alert and oriented. No distress noted. Pleasant and cooperative.  Head:  Normocephalic and atraumatic. Lungs:  Clear to auscultation bilaterally. No wheezes, rales, or rhonchi. No distress.  Heart:  S1, S2 present without murmurs, rubs, or gallops. Regular rate and rhythm. Abdomen:  +BS, soft, and non-distended. Mild fullness per pateint to palpation of the lower abdomen. No rebound or guarding. No HSM or masses noted. Msk:  Symmetrical without gross deformities. Normal posture. Extremities:  Without edema. Neurologic:  Alert and   oriented x4;  grossly normal neurologically. Skin:  Intact without significant lesions or rashes. Psych:  Alert and cooperative. Normal mood and affect.    03/11/2014 9:26 AM

## 2014-03-12 ENCOUNTER — Other Ambulatory Visit (HOSPITAL_COMMUNITY): Payer: BLUE CROSS/BLUE SHIELD

## 2014-03-12 NOTE — Assessment & Plan Note (Addendum)
Symptomatically seems improved. CT abdomen done with no active colitis but pelvis not immaged due to insurance denial of both abdomen and pelvis. After re-request and peer to peer pelvic imaging was approved but has not been completed yet. Patient has been trialed on multiple biologics with either treatmetn failure or intolerable side effects/allergic reactions. Had tried Asacol previously with questionable adverse effect. After last EGD recommend possible Lialda 2.4g daily with slow titration as needed. Will await results of pelvic imaging and check BMP for renal function before starting any meds. Can call patient with results and recommendations due to the situation and options were fully discussed at the visit. Return in 3 months for re-evaluation of symptoms.

## 2014-03-12 NOTE — Assessment & Plan Note (Signed)
Continued GERD symptoms, was recommended Zegrid at time of EGD which the patient could not tolerate due to "my ankles swelled up." Has tried and failed Prevacid and Zantac. Will trial Protonix 40 mg daily. No red flag/warning signs. Return in 3 months for re-evaluation.

## 2014-03-12 NOTE — Progress Notes (Signed)
CC'ED TO PCP 

## 2014-03-19 ENCOUNTER — Encounter (HOSPITAL_COMMUNITY): Payer: Self-pay

## 2014-03-19 ENCOUNTER — Ambulatory Visit (HOSPITAL_COMMUNITY)
Admission: RE | Admit: 2014-03-19 | Discharge: 2014-03-19 | Disposition: A | Discharge status: Home / Self Care | Payer: BLUE CROSS/BLUE SHIELD | Source: Ambulatory Visit | Attending: Nurse Practitioner | Admitting: Nurse Practitioner

## 2014-03-19 DIAGNOSIS — Z8719 Personal history of other diseases of the digestive system: Secondary | ICD-10-CM | POA: Diagnosis not present

## 2014-03-19 DIAGNOSIS — R197 Diarrhea, unspecified: Secondary | ICD-10-CM | POA: Diagnosis not present

## 2014-03-19 DIAGNOSIS — R634 Abnormal weight loss: Secondary | ICD-10-CM | POA: Diagnosis not present

## 2014-03-19 DIAGNOSIS — R1033 Periumbilical pain: Secondary | ICD-10-CM | POA: Insufficient documentation

## 2014-03-19 MED ORDER — IOHEXOL 300 MG/ML  SOLN
100.0000 mL | Freq: Once | INTRAMUSCULAR | Status: AC | PRN
  Administered 2014-03-19: 100 mL via INTRAVENOUS

## 2014-03-26 NOTE — Progress Notes (Signed)
ON RECALL FOR CT CHEST  °

## 2014-03-30 NOTE — Progress Notes (Signed)
Reminder in epic °

## 2014-06-04 ENCOUNTER — Encounter: Payer: Self-pay | Admitting: Nurse Practitioner

## 2014-06-04 ENCOUNTER — Ambulatory Visit (INDEPENDENT_AMBULATORY_CARE_PROVIDER_SITE_OTHER): Payer: BLUE CROSS/BLUE SHIELD | Admitting: Nurse Practitioner

## 2014-06-04 VITALS — BP 119/70 | HR 68 | Temp 97.5°F | Ht 61.0 in | Wt 149.2 lb

## 2014-06-04 DIAGNOSIS — K219 Gastro-esophageal reflux disease without esophagitis: Secondary | ICD-10-CM | POA: Diagnosis not present

## 2014-06-04 DIAGNOSIS — K50919 Crohn's disease, unspecified, with unspecified complications: Secondary | ICD-10-CM | POA: Diagnosis not present

## 2014-06-04 MED ORDER — MESALAMINE 1.2 G PO TBEC: 2.4000 g | DELAYED_RELEASE_TABLET | Freq: Every day | ORAL | Status: DC

## 2014-06-04 NOTE — Patient Instructions (Signed)
1. Continue taking her Protonix. 2. Start taking the Lialda 2.4 g (2 tabs) every morning with breakfast. 3. Return for follow-up in 6 months for routine care and to check her labs. He can call us sooner if she having any issues. 4. If you start having an allergic reaction to the medication, stop taking the medicine and call us.

## 2014-06-04 NOTE — Progress Notes (Signed)
Referring Provider: Romeo Rabon, MD Primary Care Physician:  Romeo Rabon, MD Primary GI:  Dr. Jena Gauss  Chief Complaint  Patient presents with  . Gastrophageal Reflux  . Crohn's Disease    HPI:   69 year old female presents for further evaluation of GERD and Crohn's as a follow-up to her EGD. EGD completed to 02/11/2014 and found normal EGD, symptoms out of proportion for findings and recommended abdominal pelvic CT, serum lipase, LFTs, metabolic profile, and CBC. Also recommended stress test to cover all bases. Provided one-month trial of Zegerid 40 mg daily to trial for symptomatic relief with instructions to hold her Prevacid. Recommended office visit in 4-6 weeks to revisit the idea of mesalamine therapy for Crohn's colitis management versus a much more complicated regimen of immunosuppressive/biologic agents. At last visit on 03/11/2014 patient was amenable to trialing Laurene Footman to 2.4 g daily. Attempted one-month trial of Zegerid 40 mg but she stopped it on her own due to lower extremity edema. His currently on Prevacid and Zantac. At last visit she states she was doing quite well. Did complain of persistent GERD symptoms and was trialed on Protonix 40 mg daily versus Prevacid and Zantac she was taking.   Today she states she had another colitis flare up a couple weeks ago with 10 bowel movements a day, abdominal pain, back pain. Stools were watery and had mucus but no blood. Did also have an exacerbation of hemorrhoid symptoms. She is amendable to starting Lialda 2.4 mg for improved symptoms. Has some minimal breakthrough symptoms on Protonix, although her symptoms are much improved on Protonix. Denies current abdominal pain, N/V, hematochezia, melena, fever, chills, unintentional weight loss. Denies any other upper or lower GI symptoms. States she had a cardiology workup and everything was normal.   Past Medical History  Diagnosis Date  . GERD (gastroesophageal reflux disease)   .  Hiatal hernia   . Hyperlipidemia   . Cervical radiculopathy   . Crohn disease   . Hypertension   . Edema   . Anxiety     Past Surgical History  Procedure Laterality Date  . Tonsillectomy    . Hemorroidectomy    . Back surgery      c5  . Cholecystectomy    . Abdominal hysterectomy    . Bladder surgery    . Esophagogastroduodenoscopy endoscopy  04-11-2012    Dr. Aleene Davidson  . Breast biopsy      benign  . Foraminectomy    . Colonoscopy  07/02/2008    Dr. Aleene Davidson  . Esophagogastroduodenoscopy N/A 02/11/2014    RMR: normal EGD    Current Outpatient Prescriptions  Medication Sig Dispense Refill  . acyclovir (ZOVIRAX) 200 MG capsule Take 5 capsules by mouth every 3 (three) hours as needed (leg infection).     . Ascorbic Acid (VITAMIN C) 1000 MG tablet Take 1,000 mg by mouth daily.    . B Complex-Folic Acid (B COMPLEX-VITAMIN B12 PO) Take 1 tablet by mouth daily.    . Cholecalciferol (VITAMIN D-3 PO) Take by mouth daily.    . DULoxetine (CYMBALTA) 30 MG capsule Take 30 mg by mouth daily.     . hydrOXYzine (ATARAX/VISTARIL) 25 MG tablet Take 1 tablet by mouth daily as needed for itching.     . lansoprazole (PREVACID) 30 MG capsule Take 30 mg by mouth daily at 12 noon.     Marland Kitchen LORazepam (ATIVAN) 1 MG tablet Take 1 tablet by mouth daily as needed for sleep.     Marland Kitchen  meloxicam (MOBIC) 15 MG tablet Take 15 mg by mouth daily as needed for pain.     . MetFORMIN HCl (GLUCOPHAGE PO) Take 250 mg by mouth daily.    Marland Kitchen MINIVELLE 0.05 MG/24HR patch Place 1 patch onto the skin 2 (two) times a week.     . Multiple Vitamin (MULTIVITAMIN) capsule Take 1 capsule by mouth daily.    . Omega-3 Fatty Acids (OMEGA-3 FISH OIL PO) Take by mouth.    . pantoprazole (PROTONIX) 40 MG tablet Take 1 tablet (40 mg total) by mouth daily. 90 tablet 3  . ranitidine (ZANTAC) 150 MG capsule Take 150 mg by mouth every evening.    . Red Yeast Rice 600 MG CAPS Take 600 mg by mouth 2 (two) times daily.    . traMADol (ULTRAM) 50  MG tablet Take 50 mg by mouth every 6 (six) hours as needed for moderate pain.     . TURMERIC PO Take 505 mg by mouth 2 (two) times daily.    . vitamin B-12 (CYANOCOBALAMIN) 1000 MCG tablet Take 1,000 mcg by mouth daily.    Marland Kitchen zolpidem (AMBIEN) 10 MG tablet Take 10 mg by mouth at bedtime as needed for sleep.   2   No current facility-administered medications for this visit.    Allergies as of 06/04/2014 - Review Complete 06/04/2014  Allergen Reaction Noted  . Sulfa antibiotics Anaphylaxis 01/22/2014  . Asacol [mesalamine] Itching 01/29/2014  . Methotrexate derivatives Itching 01/22/2014  . Remicade [infliximab] Other (See Comments) 01/22/2014  . Trazodone and nefazodone Other (See Comments) 03/11/2014    Family History  Problem Relation Age of Onset  . Colon cancer Neg Hx   . Dementia Mother   . Lung cancer Father     History   Social History  . Marital Status: Married    Spouse Name: N/A  . Number of Children: N/A  . Years of Education: N/A   Social History Main Topics  . Smoking status: Current Every Day Smoker -- 0.50 packs/day    Types: Cigarettes  . Smokeless tobacco: Not on file  . Alcohol Use: No  . Drug Use: No  . Sexual Activity: Not on file   Other Topics Concern  . None   Social History Narrative    Review of Systems: General: Negative for anorexia, weight loss, fever, chills, fatigue, weakness. Eyes: Negative for vision changes.  ENT: Negative for hoarseness, difficulty swallowing , nasal congestion. CV: Negative for chest pain, angina, palpitations, dyspnea on exertion, peripheral edema.  Respiratory: Negative for dyspnea at rest, dyspnea on exertion, cough, sputum, wheezing.  GI: See history of present illness. GU:  Negative for dysuria, hematuria, urinary incontinence, urinary frequency, nocturnal urination.  MS: Negative for joint pain, low back pain.  Derm: Negative for rash or itching. Neuro: Negative for weakness, abnormal sensation, seizure,  frequent headaches, memory loss, confusion.  Psych: Negative for anxiety, depression, suicidal ideation, hallucinations.  Endo: Negative for unusual weight change.  Heme: Negative for bruising or bleeding. Allergy: Negative for rash or hives.   Physical Exam: BP 119/70 mmHg  Pulse 68  Temp(Src) 97.5 F (36.4 C)  Ht 5\' 1"  (1.549 m)  Wt 149 lb 3.2 oz (67.677 kg)  BMI 28.21 kg/m2 General:   Alert and oriented. Pleasant and cooperative. Well-nourished and well-developed.  Head:  Normocephalic and atraumatic. Eyes:  Without icterus, sclera clear and conjunctiva pink.  Ears:  Normal auditory acuity. Mouth:  No deformity or lesions, oral mucosa pink.  Throat/Neck:  Supple, without mass or thyromegaly. Cardiovascular:  S1, S2 present without murmurs appreciated. Normal pulses noted. Extremities without clubbing or edema. Respiratory:  Clear to auscultation bilaterally. No wheezes, rales, or rhonchi. No distress.  Gastrointestinal:  +BS, soft, non-tender and non-distended. No HSM noted. No guarding or rebound. No masses appreciated.  Rectal:  Deferred  Musculoskalatal:  Symmetrical without gross deformities. Normal posture. Skin:  Intact without significant lesions or rashes. Neurologic:  Alert and oriented x4;  grossly normal neurologically. Psych:  Alert and cooperative. Normal mood and affect. Heme/Lymph/Immune: No significant cervical adenopathy. No excessive bruising noted.    06/04/2014 1:31 PM

## 2014-06-04 NOTE — Progress Notes (Signed)
CC'ED TO PCP 

## 2014-06-04 NOTE — Assessment & Plan Note (Signed)
GERD symptoms under much better control with Protonix, which she tolerates much better than other PPIs. Continue taking her Protonix for GERD symptoms. We'll have her follow up in 6 months for reevaluation.

## 2014-06-04 NOTE — Assessment & Plan Note (Signed)
Patient with a history of Crohn's colitis previous eczema Dr. Jorja Loa our in [place]. Has had allergic reactions to multiple medications. At last colonoscopy recommended tempting Lialda 2.4 g daily for maintenance/remission therapy. Patient advised that if she thinks is having allergic reaction to any medication to stop the medication cost. Her CT of the abdomen and pelvis was unremarkable, labs within normal limits, most recent BMP shows stable/normal kidney function. We'll have her return in 6 months for routine follow-up to check on the progression of her symptoms and to recheck her labs.

## 2014-06-11 ENCOUNTER — Ambulatory Visit: Payer: BLUE CROSS/BLUE SHIELD | Admitting: Nurse Practitioner

## 2014-11-27 ENCOUNTER — Ambulatory Visit: Payer: BLUE CROSS/BLUE SHIELD | Admitting: Nurse Practitioner

## 2015-01-21 ENCOUNTER — Encounter: Payer: Self-pay | Admitting: Nurse Practitioner

## 2015-01-21 ENCOUNTER — Ambulatory Visit (INDEPENDENT_AMBULATORY_CARE_PROVIDER_SITE_OTHER): Payer: BLUE CROSS/BLUE SHIELD | Admitting: Nurse Practitioner

## 2015-01-21 VITALS — BP 130/70 | HR 68 | Temp 97.4°F | Ht 61.0 in | Wt 149.2 lb

## 2015-01-21 DIAGNOSIS — R197 Diarrhea, unspecified: Secondary | ICD-10-CM | POA: Diagnosis not present

## 2015-01-21 DIAGNOSIS — K50119 Crohn's disease of large intestine with unspecified complications: Secondary | ICD-10-CM | POA: Diagnosis not present

## 2015-01-21 DIAGNOSIS — K219 Gastro-esophageal reflux disease without esophagitis: Secondary | ICD-10-CM | POA: Diagnosis not present

## 2015-01-21 MED ORDER — DICYCLOMINE HCL 10 MG PO CAPS: 10.0000 mg | ORAL_CAPSULE | Freq: Three times a day (TID) | ORAL | Status: DC | PRN

## 2015-01-21 NOTE — Assessment & Plan Note (Signed)
Currently well-controlled on Zantac and Protonix. Continue to monitor, return for follow-up as needed.

## 2015-01-21 NOTE — Progress Notes (Signed)
cc'ed to pcp °

## 2015-01-21 NOTE — Progress Notes (Signed)
Referring Provider: Romeo Rabon, MD Primary Care Physician:  Romeo Rabon, MD Primary GI:  Dr. Jena Gauss  Chief Complaint  Patient presents with  . Follow-up    HPI:   70 year old female presents for follow-up on Crohn's disease. Last seen in our office on 06/04/2014. At that time for her GERD symptoms she was on Prevacid and Zantac but still having flares pink and is considering changing to Protonix. She was started on the out the 2.4 g daily. Her renal function at last office visit was good. She was recommended for 3 month follow-up but scheduled visits in June and November were both canceled and now presents 1 year post-last visit.  Today she states she could not tolerate Lialda due to itching side effects. Will had a flare up of diarrhea typically after a large meal or with stress, typically a couple times a week. Will start Immodium when she begins to have diarrhea and typically resolves in a day or so. Denies hematochezia, melena. She does have hemorrhoids and is interested in banding. Hemorrhoids flare when she is having diarrhea. Denies fever, chills, unintentional weight loss, vomiting. Has nausea if diarrhea is severe, which is not often. GERD symptoms are well controlled on Zantac and Protonix. Denies chest pain, dyspnea, dizziness, lightheadedness, syncope, near syncope. Denies any other upper or lower GI symptoms.  Past Medical History  Diagnosis Date  . GERD (gastroesophageal reflux disease)   . Hiatal hernia   . Hyperlipidemia   . Cervical radiculopathy   . Crohn disease (HCC)   . Hypertension   . Edema   . Anxiety     Past Surgical History  Procedure Laterality Date  . Tonsillectomy    . Hemorroidectomy    . Back surgery      c5  . Cholecystectomy    . Abdominal hysterectomy    . Bladder surgery    . Esophagogastroduodenoscopy endoscopy  04-11-2012    Dr. Aleene Davidson  . Breast biopsy      benign  . Foraminectomy    . Colonoscopy  07/02/2008    Dr. Aleene Davidson   . Esophagogastroduodenoscopy N/A 02/11/2014    RMR: normal EGD    Current Outpatient Prescriptions  Medication Sig Dispense Refill  . Ascorbic Acid (VITAMIN C) 1000 MG tablet Take 1,000 mg by mouth daily.    . B Complex-Folic Acid (B COMPLEX-VITAMIN B12 PO) Take 1 tablet by mouth daily.    . Cholecalciferol (VITAMIN D-3 PO) Take by mouth daily.    . DULoxetine (CYMBALTA) 30 MG capsule Take 30 mg by mouth daily.     . hydrOXYzine (ATARAX/VISTARIL) 25 MG tablet Take 1 tablet by mouth daily as needed for itching.     . lansoprazole (PREVACID) 30 MG capsule Take 30 mg by mouth daily at 12 noon.     Marland Kitchen LORazepam (ATIVAN) 1 MG tablet Take 1 tablet by mouth daily as needed for sleep.     . meloxicam (MOBIC) 15 MG tablet Take 15 mg by mouth daily as needed for pain.     . MetFORMIN HCl (GLUCOPHAGE PO) Take 250 mg by mouth daily.    Marland Kitchen MINIVELLE 0.05 MG/24HR patch Place 1 patch onto the skin 2 (two) times a week.     . Multiple Vitamin (MULTIVITAMIN) capsule Take 1 capsule by mouth daily.    . Omega-3 Fatty Acids (OMEGA-3 FISH OIL PO) Take by mouth.    . pantoprazole (PROTONIX) 40 MG tablet Take 1 tablet (40 mg total) by mouth  daily. 90 tablet 3  . ranitidine (ZANTAC) 150 MG capsule Take 150 mg by mouth every evening.    . Red Yeast Rice 600 MG CAPS Take 600 mg by mouth 2 (two) times daily.    . traMADol (ULTRAM) 50 MG tablet Take 50 mg by mouth every 6 (six) hours as needed for moderate pain.     . TURMERIC PO Take 505 mg by mouth 2 (two) times daily.    . vitamin B-12 (CYANOCOBALAMIN) 1000 MCG tablet Take 1,000 mcg by mouth daily.    Marland Kitchen zolpidem (AMBIEN) 10 MG tablet Take 10 mg by mouth at bedtime as needed for sleep.   2  . acyclovir (ZOVIRAX) 200 MG capsule Take 5 capsules by mouth every 3 (three) hours as needed (leg infection). Reported on 01/21/2015    . mesalamine (LIALDA) 1.2 G EC tablet Take 2 tablets (2.4 g total) by mouth daily with breakfast. (Patient not taking: Reported on 01/21/2015) 60  tablet 3   No current facility-administered medications for this visit.    Allergies as of 01/21/2015 - Review Complete 01/21/2015  Allergen Reaction Noted  . Sulfa antibiotics Anaphylaxis 01/22/2014  . Asacol [mesalamine] Itching 01/29/2014  . Methotrexate derivatives Itching 01/22/2014  . Remicade [infliximab] Other (See Comments) 01/22/2014  . Trazodone and nefazodone Other (See Comments) 03/11/2014    Family History  Problem Relation Age of Onset  . Colon cancer Neg Hx   . Dementia Mother   . Lung cancer Father     Social History   Social History  . Marital Status: Married    Spouse Name: N/A  . Number of Children: N/A  . Years of Education: N/A   Social History Main Topics  . Smoking status: Current Every Day Smoker -- 0.50 packs/day    Types: Cigarettes  . Smokeless tobacco: None     Comment: Received an Rx for Chantix and will attempt quitting in a few weeks after vacation.  . Alcohol Use: No  . Drug Use: No  . Sexual Activity: Not Asked   Other Topics Concern  . None   Social History Narrative    Review of Systems: General: Negative for anorexia, weight loss, fever, chills, fatigue, weakness. ENT: Negative for hoarseness, difficulty swallowing. CV: Negative for chest pain, angina, palpitations, peripheral edema.  Respiratory: Negative for dyspnea at rest, cough, sputum, wheezing.  GI: See history of present illness. Endo: Negative for unusual weight change.  Heme: Negative for bruising or bleeding.   Physical Exam: BP 130/70 mmHg  Pulse 68  Temp(Src) 97.4 F (36.3 C) (Oral)  Ht  (1.549 m)  Wt 149 lb 3.2 oz (67.677 kg)  BMI 28.21 kg/m2 General:   Alert and oriented. Pleasant and cooperative. Well-nourished and well-developed.  Head:  Normocephalic and atraumatic. Eyes:  Without icterus, sclera clear and conjunctiva pink.  Ears:  Normal auditory acuity. Cardiovascular:  S1, S2 present without murmurs appreciated. Normal pulses noted.  Extremities without clubbing or edema. Respiratory:  Clear to auscultation bilaterally. No wheezes, rales, or rhonchi. No distress.  Gastrointestinal:  +BS, soft, and non-distended. Mild RLQ TTP. No HSM noted. No guarding or rebound. No masses appreciated.  Rectal:  Deferred  Musculoskalatal:  Symmetrical without gross deformities. Normal posture. Neurologic:  Alert and oriented x4;  grossly normal neurologically. Psych:  Alert and cooperative. Normal mood and affect.    01/21/2015 11:39 AM

## 2015-01-21 NOTE — Patient Instructions (Signed)
1. Continue taking Protonix and Zantac. 2. I sent and Bentyl 10 mg to your pharmacy. Take it 3 times a day as needed for abdominal pain and/or diarrhea. 3. Return for follow-up in 6-8 weeks to discuss whether we want to try another Crohn's medication. We can also discussed possibility or desire for hemorrhoid banding at this time.

## 2015-01-21 NOTE — Assessment & Plan Note (Signed)
Patient with persistent diarrhea which occurs approximately 2 times a week, last one day and self resolves. Does be triggered by increased stress. Denies rectal bleeding or abdominal pain. She does have a history of Crohn's disease and is been through essentially every agent for Crohn's. She could also have an overlay of irritable bowel syndrome. We'll trial her on Bentyl 10 mg 3 times a day as needed for diarrhea to see if this helps. Return for follow-up in 6-8 weeks.

## 2015-01-21 NOTE — Assessment & Plan Note (Signed)
Agent with a history of Crohn's disease. She is tried essentially every agent for Crohn's disease which she either failed to improve on or had an adverse effect. Currently she has diarrhea one to 2 times a week not associated with abdominal pain or blood. She takes Lomotil and diarrhea tends to clear within a day, 2 at most. Possible overlay with irritable bowel as discussed below. We'll have her return in 6-8 weeks to follow-up and revisit if there is a need for endoscopic evaluation to further correlate her symptoms to physical findings. I also need to revisit the idea of retrying Crohn's agent. She also wants to discuss possible hemorrhoid banding at this time, although she does not think she could undergo banding until May of this year.

## 2015-03-01 ENCOUNTER — Encounter: Payer: Self-pay | Admitting: Internal Medicine

## 2015-03-01 ENCOUNTER — Telehealth: Payer: Self-pay | Admitting: Internal Medicine

## 2015-03-01 NOTE — Telephone Encounter (Signed)
MARCH RECALL FOR CT CHEST

## 2015-03-01 NOTE — Telephone Encounter (Signed)
Letter in the mail 

## 2015-12-22 IMAGING — CT CT ABD-PELV W/ CM
2 of 5 series · 15 of 46 positions shown, 17 images · IV contrast (Omnipaque 300)
Comparison: 02/19/2014

CLINICAL DATA: Crohn's disease with periumbilical abdominal pain.
Diarrhea for 3 months with weight loss. History of bladder tacking.

EXAM:
CT ABDOMEN AND PELVIS WITH CONTRAST
TECHNIQUE: Multidetector CT imaging of the abdomen and pelvis was performed
using the standard protocol following bolus administration of
intravenous contrast.
CONTRAST:  100mL OMNIPAQUE IOHEXOL 300 MG/ML  SOLN

[Series 2: abd_pel_with 5.0 b40f · axial · 0.68mm/px · z∈[-453,-53]mm · 12 of 90 slices shown, 14 images]
[im 5/90  soft-tissue]
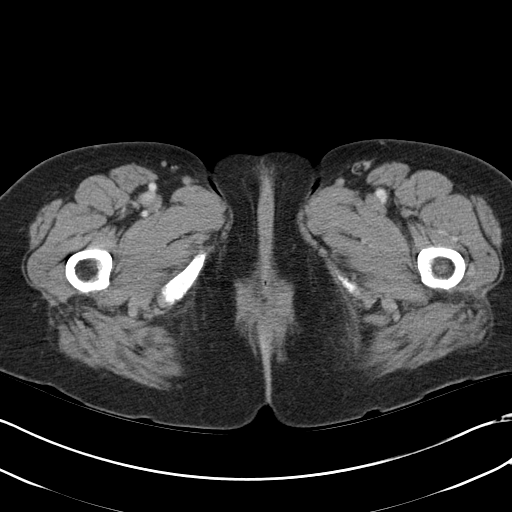
[im 5/90  bone]
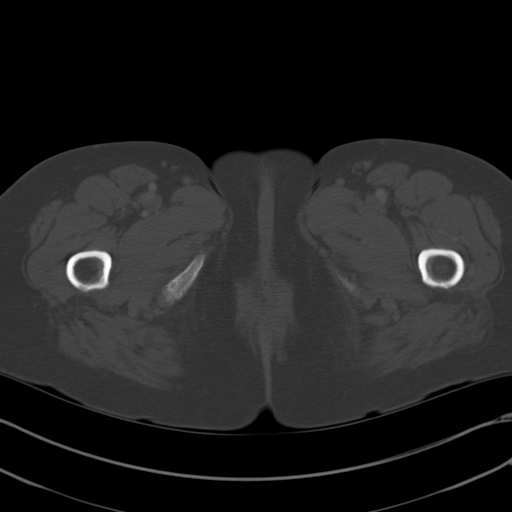
[im 15/90  soft-tissue]
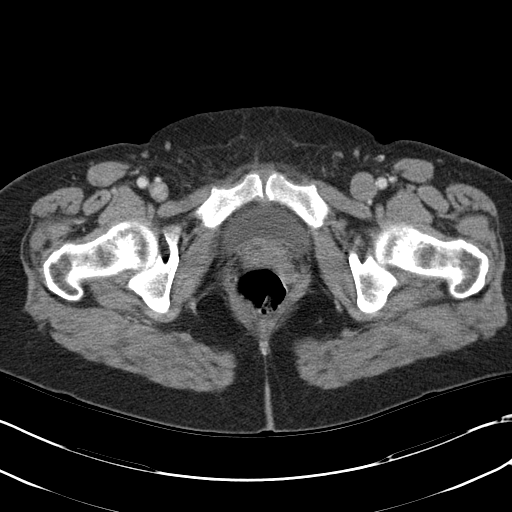
[im 20/90  soft-tissue]
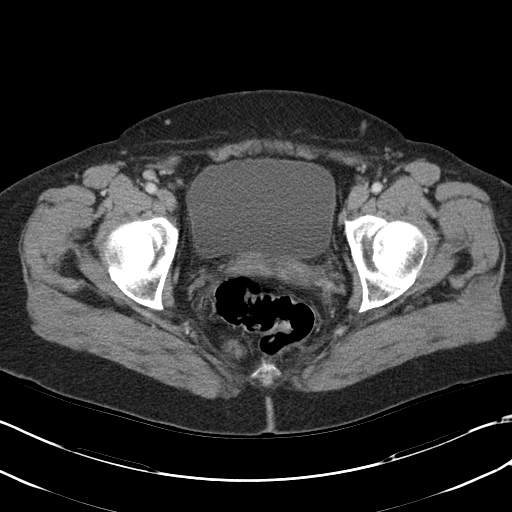
[im 25/90  soft-tissue]
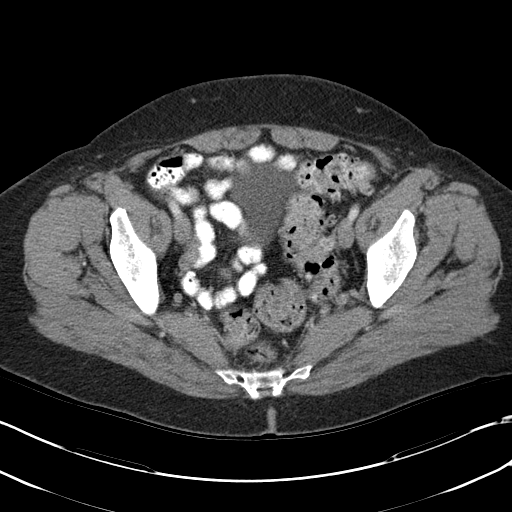
[im 35/90  soft-tissue]
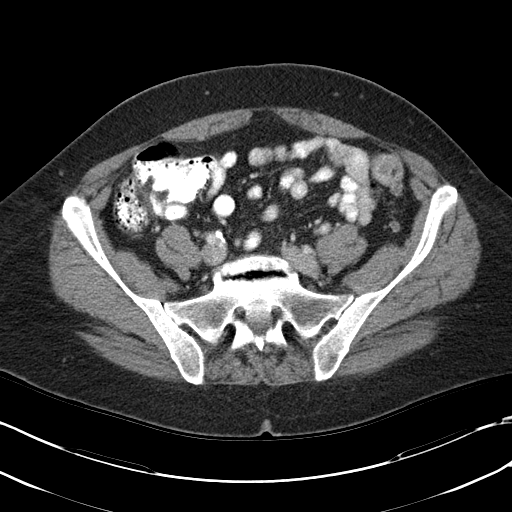
[im 40/90  soft-tissue]
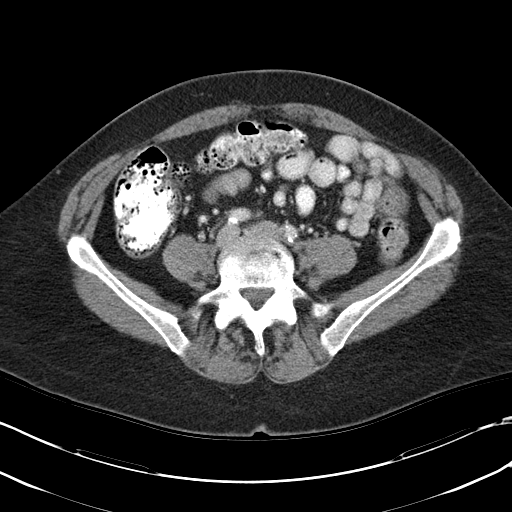
[im 50/90  soft-tissue]
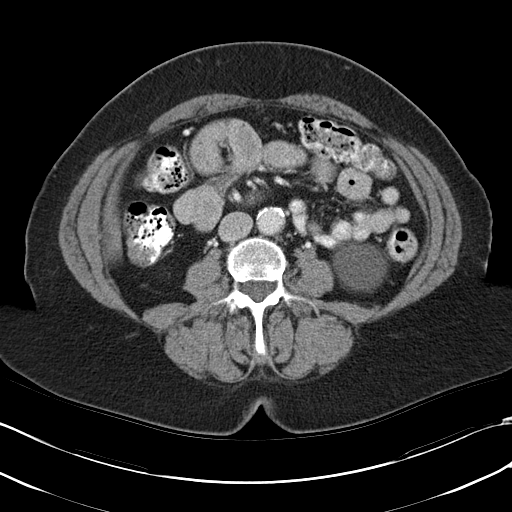
[im 55/90  soft-tissue]
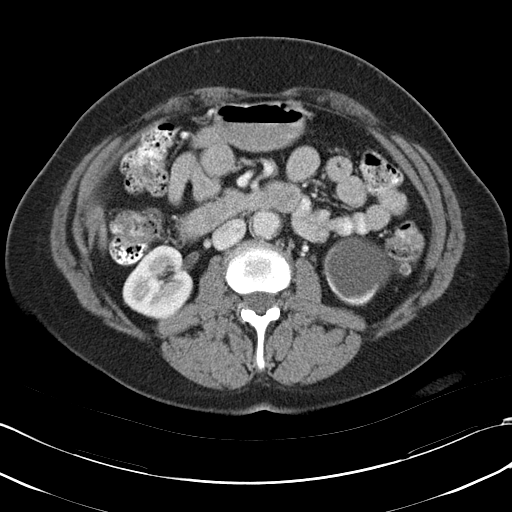
[im 65/90  soft-tissue]
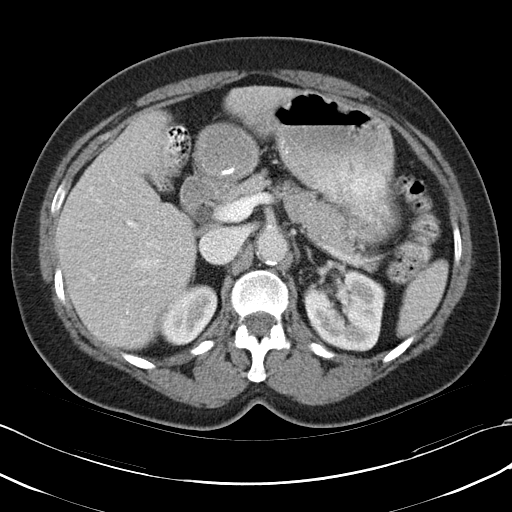
[im 65/90  bone]
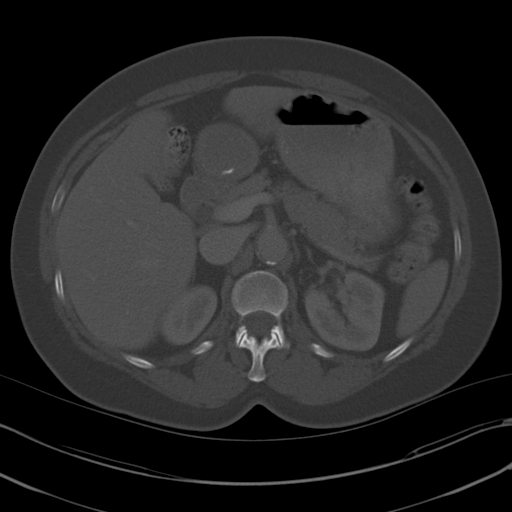
[im 70/90  soft-tissue]
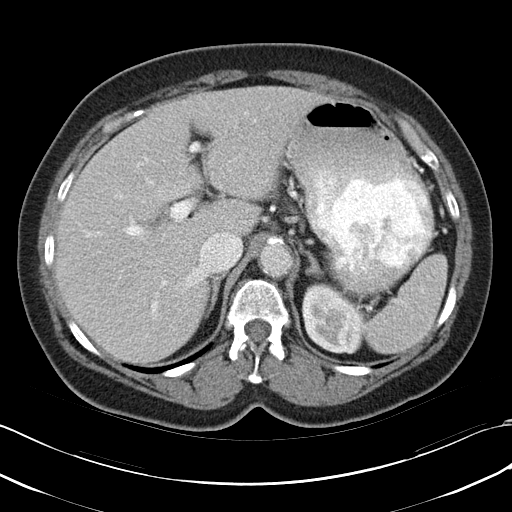
[im 75/90  soft-tissue]
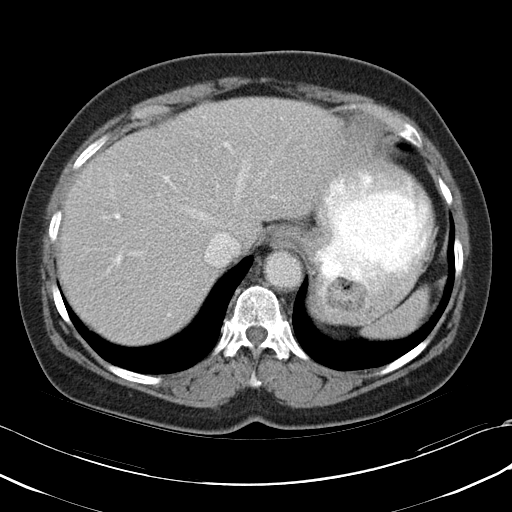
[im 85/90  soft-tissue]
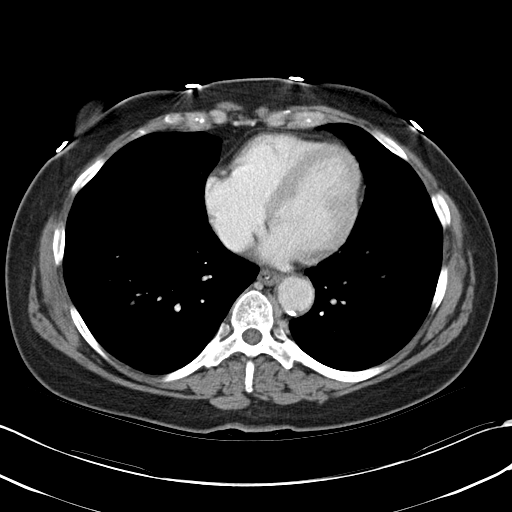

[Series 3: abd_pel_with 3.0 spo cor · coronal · 0.73mm/px · 3 of 78 slices shown]
[im 26/78  soft-tissue]
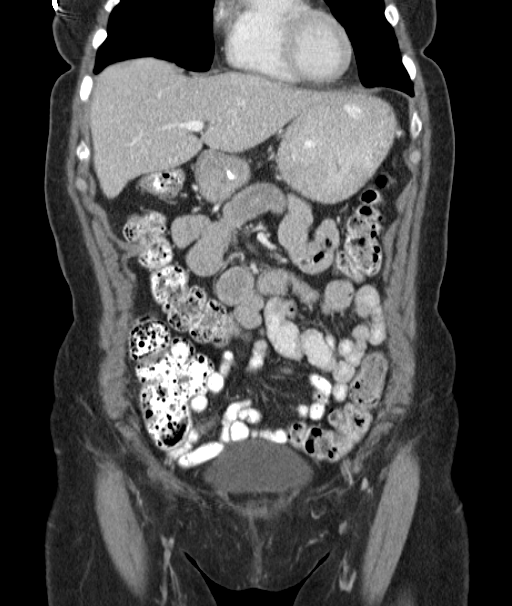
[im 35/78  soft-tissue]
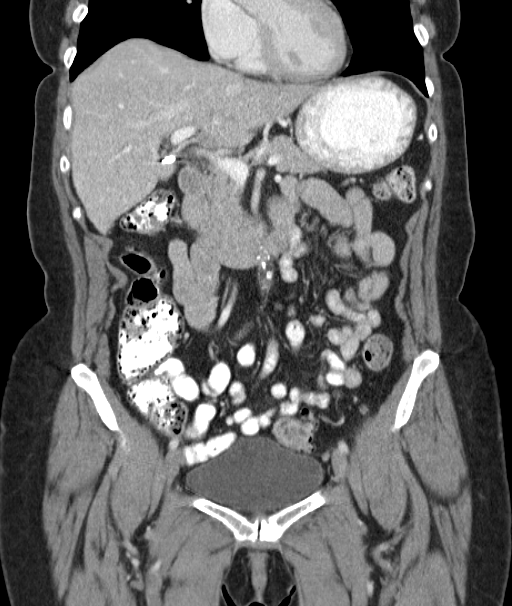
[im 43/78  soft-tissue]
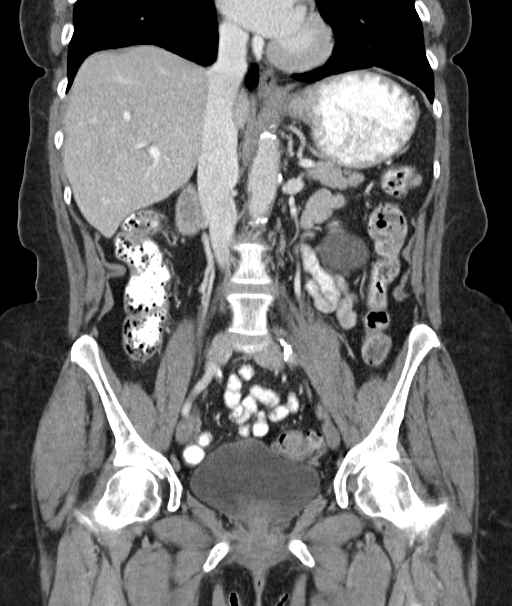

[15 of 46 positions shown; findings below may reference images not displayed]

FINDINGS: Again noted are small subtle nodular densities at both lung bases.
Largest nodule roughly measures 3 mm and similar to the previous
examination. Negative for pleural effusions.

Negative for free intraperitoneal air. The gallbladder has been
removed. Stable appearance of the liver and the portal venous system
is patent. Normal appearance of the spleen, pancreas and adrenal
glands. Stable appearance of the right kidney without
hydronephrosis. Again noted are hypodensities in the left kidney
which are most compatible with cysts. There is an 1.1 cm exophytic
structure along the left kidney upper pole which is mildly dense
with Hounsfield units around 25. The Hounsfield units do not
significantly change on the delayed images and favor a mildly
hyperdense cyst. Fluid in urinary bladder without gross abnormality.
Uterus has been removed.

No significant free fluid or lymphadenopathy. Again noted is diffuse
atherosclerotic disease in the abdominal aorta and common iliac
arteries. Normal caliber of the abdominal aorta without aneurysm.
Diverticulosis in the sigmoid colon without acute inflammation. The
small bowel and right colon have oral contrast. There is a normal
appearance of the terminal ileum. No evidence for small bowel wall
thickening. There is a small soft tissue structure in the right
hemipelvis which could represent residual ovarian tissue.

Small sclerotic focus in the right ilium on sequence 2, image 60 is
nonspecific and could represent a small bone island. Disc space loss
at L5-S1 with vacuum disc. Facet arthropathy in the lower lumbar
spine.
IMPRESSION: No acute abnormalities to explain patient's symptoms. Specifically,
no evidence for wall thickening or inflammation involving the bowel.

Left renal cysts. Presumed cyst along the left kidney upper pole is
mildly hyperdense as described. Although this cannot be definitively
characterized, favor a mildly complex or hemorrhagic cyst.

Again noted are multiple small nodular densities throughout the lung
bases. This is unchanged since 02/19/2014. These findings are
nonspecific but could be postinflammatory. If the patient is at high
risk for bronchogenic carcinoma, follow-up chest CT at 1 year is
recommended. If the patient is at low risk, no follow-up is needed.
This recommendation follows the consensus statement: Guidelines for
Management of Small Pulmonary Nodules Detected on CT Scans: A
Statement from the [HOSPITAL] as published in Radiology

## 2023-01-15 ENCOUNTER — Ambulatory Visit (INDEPENDENT_AMBULATORY_CARE_PROVIDER_SITE_OTHER): Payer: Medicare Other | Admitting: Internal Medicine

## 2023-01-15 VITALS — BP 128/74 | HR 80 | Temp 98.8°F | Resp 16 | Ht 59.84 in | Wt 160.4 lb

## 2023-01-15 DIAGNOSIS — T50905A Adverse effect of unspecified drugs, medicaments and biological substances, initial encounter: Secondary | ICD-10-CM

## 2023-01-15 DIAGNOSIS — Z888 Allergy status to other drugs, medicaments and biological substances status: Secondary | ICD-10-CM

## 2023-01-15 DIAGNOSIS — L299 Pruritus, unspecified: Secondary | ICD-10-CM | POA: Diagnosis not present

## 2023-01-15 NOTE — Patient Instructions (Addendum)
 Pruritus - Will obtain skin testing to environmental allergens  - Will do blood testing today also  - Can consider patch testing to metals   Drug Reaction - There is no way to predict drug allergies.  There is unfortunately no testing available for many of the drugs (we only have testing for penicillin). If a specific medication is required, may need a referral to academic Allergy  center for testing.    Hold all anti-histamines (Xyzal, Allegra, Zyrtec , Claritin, Benadryl) 3 days prior to next visit.  Follow up: 1/13 at 10 AM for skin testing 1-55

## 2023-01-15 NOTE — Progress Notes (Signed)
 NEW PATIENT  Date of Service/Encounter:  01/15/23  Consult requested by: Lonna Millman, DO   Subjective:   Whitney Chapman (DOB: [birth year]) is a 78 y.o. female who presents to the clinic on 01/15/2023 with a chief complaint of Allergic Reaction (Says she has allergies to medications. 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 ) .    History obtained from: chart review and patient.  Pruritus: Reports having multiple drug allergies that present as itching.  Initially all started when she was diagnosed with coronary spasms and presented with vtach.  Underwent pacemaker/defib and started on amiodarone.  Does wonder if the drug allergies are messing with her lymphatic system since she has gained over 20 lbs in the last few years with pitting edema.  She is seeing cardiology but has over time stopped or weaned down the dose of many of her medications due the to itching.  Instead is eating healthier and quit smoking.  More recently, also concerned for esophageal spasms/GERD and noted itching with Pepcid.   Zyrtec  does sometimes help with the itching Worried if the pacemaker is what she is allergic too Feet/hands/arms are worse.  Usually does not have a rash unless she scratches and then gets little bumps.  No hives. No dry rashes. No blisters.   Reviewed:  11/08/2022: seen by [facility]; noted to have multiple drug allergies, referred to Allergist.  09/05/2022: seen by [facility] cardiology for coronary vasospasm, chest pain, chronic smoker.  Discussed Zio monitor.   03/06/2022: seen by Marny NP for lung nodule, tobacco use, OSA, HTN.   Past Medical History: Past Medical History:  Diagnosis Date   Anxiety    Cervical radiculopathy    Crohn disease (HCC)    Edema    GERD (gastroesophageal reflux disease)    Hiatal hernia    Hyperlipidemia    Hypertension    Past Surgical History: Past Surgical History:  Procedure Laterality Date   ABDOMINAL HYSTERECTOMY     BACK SURGERY     c5    BLADDER SURGERY     BREAST BIOPSY     benign   CHOLECYSTECTOMY     COLONOSCOPY  07/02/2008   Dr. Rhoda   ESOPHAGOGASTRODUODENOSCOPY N/A 02/11/2014   RMR: normal EGD   ESOPHAGOGASTRODUODENOSCOPY ENDOSCOPY  04-11-2012   Dr. Rhoda   foraminectomy     HEMORROIDECTOMY     TONSILLECTOMY      Family History: Family History  Problem Relation Age of Onset   Colon cancer Neg Hx    Dementia Mother    Lung cancer Father    Medication List:  Allergies as of 01/15/2023       Reactions   Sulfa Antibiotics Anaphylaxis   Remicade [infliximab] Other (See Comments)   lipoidal reaction.   Asacol  [mesalamine ] Itching   Methotrexate Derivatives Itching   Trazodone And Nefazodone Other (See Comments)   Made her hyper        Medication List        Accurate as of January 15, 2023  2:55 PM. If you have any questions, ask your nurse or doctor.          STOP taking these medications    dicyclomine  10 MG capsule Commonly known as: BENTYL    LORazepam 1 MG tablet Commonly known as: ATIVAN   meloxicam 15 MG tablet Commonly known as: MOBIC   pantoprazole  40 MG tablet Commonly known as: PROTONIX    ranitidine 150 MG capsule Commonly known as: ZANTAC   zolpidem 10 MG  tablet Commonly known as: AMBIEN       TAKE these medications    B COMPLEX-VITAMIN B12 PO Take 1 tablet by mouth daily.   BIOTIN PO Take 1 capsule by mouth daily.   cyanocobalamin 1000 MCG tablet Commonly known as: VITAMIN B12 Take 1,000 mcg by mouth daily.   DULoxetine 30 MG capsule Commonly known as: CYMBALTA Take 30 mg by mouth daily.   famotidine 20 MG tablet Commonly known as: PEPCID Take 1 tablet by mouth 2 (two) times daily.   GLUCOPHAGE PO Take 250 mg by mouth daily.   hydrOXYzine 25 MG tablet Commonly known as: ATARAX Take 1 tablet by mouth daily as needed for itching.   Minivelle 0.05 MG/24HR patch Generic drug: estradiol Place 1 patch onto the skin 2 (two) times a week.    multivitamin capsule Take 1 capsule by mouth daily.   nitroGLYCERIN 0.4 MG SL tablet Commonly known as: NITROSTAT Place 0.4 mg under the tongue every 5 (five) minutes as needed.   OMEGA-3 FISH OIL PO Take by mouth.   Red Yeast Rice 600 MG Caps Take 600 mg by mouth 2 (two) times daily.   traMADol 50 MG tablet Commonly known as: ULTRAM Take 50 mg by mouth every 6 (six) hours as needed for moderate pain.   TURMERIC PO Take 505 mg by mouth 2 (two) times daily.   vitamin C 1000 MG tablet Take 1,000 mg by mouth daily.   VITAMIN D-3 PO Take by mouth daily.         REVIEW OF SYSTEMS: Pertinent positives and negatives discussed in HPI.   Objective:   Physical Exam: BP 128/74   Pulse 80   Temp 98.8 F (37.1 C) (Temporal)   Resp 16   Ht 4' 11.84 (1.52 m)   Wt 160 lb 6.4 oz (72.8 kg) Comment: Says she has about 20lbs of fluid on her as well.  SpO2 95%   BMI 31.49 kg/m  Body mass index is 31.49 kg/m. GEN: alert, well developed HEENT: clear conjunctiva, nose with no inferior turbinate hypertrophy, pink nasal mucosa, no rhinorrhea, no cobblestoning HEART: regular rate and rhythm, no murmur LUNGS: clear to auscultation bilaterally, no coughing, unlabored respiration ABDOMEN: soft, non distended  SKIN: no rashes or lesions  Assessment:   1. Pruritus   2. Adverse effect of drug, initial encounter     Plan/Recommendations:  Pruritus - Will obtain skin testing to environmental allergens  - Will do blood testing today with blood counts, renal/liver function, thyroid function. PCP visit does note renal insufficiency.  - She is worried about allergy  to pacemaker.  Can consider patch testing to metals but other than itching, she has no clear rashes around the site of pacemaker or signs of poor wound healing.    Drug Reaction - Reports hx of itching with multiple drugs. Discussed this is not consistent with an IgE mediated allergy  and therefore, it is a risk vs benefit  discussion with PCP.  Can use Zyrtec  10mg  daily to control itching, if a particular medication is necessary.   - If persistent, can consider Dermatology evaluation.   - There is no way to predict drug allergies.  There is unfortunately no testing available for many of the drugs (we only have testing for penicillin). If a specific medication is required, may need a referral to academic Allergy  center for testing.    Hold all anti-histamines (Xyzal, Allegra, Zyrtec , Claritin, Benadryl) 3 days prior to next visit.  Follow up:  1/13 at 10 AM for skin testing 1-55   Arleta Blanch, MD [facility] 

## 2023-01-18 LAB — CBC WITH DIFFERENTIAL/PLATELET
Basophils Absolute: 0 10*3/uL (ref 0.0–0.2)
Basos: 0 %
EOS (ABSOLUTE): 0 10*3/uL (ref 0.0–0.4)
Eos: 1 %
Hematocrit: 38.9 % (ref 34.0–46.6)
Hemoglobin: 12.7 g/dL (ref 11.1–15.9)
Immature Grans (Abs): 0 10*3/uL (ref 0.0–0.1)
Immature Granulocytes: 0 %
Lymphocytes Absolute: 1.5 10*3/uL (ref 0.7–3.1)
Lymphs: 29 %
MCH: 31.7 pg (ref 26.6–33.0)
MCHC: 32.6 g/dL (ref 31.5–35.7)
MCV: 97 fL (ref 79–97)
Monocytes Absolute: 0.4 10*3/uL (ref 0.1–0.9)
Monocytes: 8 %
Neutrophils Absolute: 3.2 10*3/uL (ref 1.4–7.0)
Neutrophils: 62 %
Platelets: 195 10*3/uL (ref 150–450)
RBC: 4.01 x10E6/uL (ref 3.77–5.28)
RDW: 11.9 % (ref 11.7–15.4)
WBC: 5.2 10*3/uL (ref 3.4–10.8)

## 2023-01-18 LAB — CMP14+EGFR
ALT: 18 [IU]/L (ref 0–32)
AST: 20 [IU]/L (ref 0–40)
Albumin: 4.2 g/dL (ref 3.8–4.8)
Alkaline Phosphatase: 67 [IU]/L (ref 44–121)
BUN/Creatinine Ratio: 14 (ref 12–28)
BUN: 12 mg/dL (ref 8–27)
Bilirubin Total: 0.3 mg/dL (ref 0.0–1.2)
CO2: 25 mmol/L (ref 20–29)
Calcium: 9.3 mg/dL (ref 8.7–10.3)
Chloride: 102 mmol/L (ref 96–106)
Creatinine, Ser: 0.85 mg/dL (ref 0.57–1.00)
Globulin, Total: 2.5 g/dL (ref 1.5–4.5)
Glucose: 102 mg/dL — ABNORMAL HIGH (ref 70–99)
Potassium: 4.9 mmol/L (ref 3.5–5.2)
Sodium: 144 mmol/L (ref 134–144)
Total Protein: 6.7 g/dL (ref 6.0–8.5)
eGFR: 70 mL/min/{1.73_m2} (ref >59)

## 2023-01-18 LAB — TSH+FREE T4
Free T4: 0.65 ng/dL — ABNORMAL LOW (ref 0.82–1.77)
TSH: 5.32 u[IU]/mL — ABNORMAL HIGH (ref 0.450–4.500)

## 2023-01-22 ENCOUNTER — Ambulatory Visit (INDEPENDENT_AMBULATORY_CARE_PROVIDER_SITE_OTHER): Payer: Medicare Other | Admitting: Internal Medicine

## 2023-01-22 DIAGNOSIS — L299 Pruritus, unspecified: Secondary | ICD-10-CM

## 2023-01-22 MED ORDER — CETIRIZINE HCL 10 MG PO TABS
10.0000 mg | ORAL_TABLET | Freq: Two times a day (BID) | ORAL | 5 refills | Status: AC | PRN
Start: 2023-01-22 — End: ?

## 2023-01-22 NOTE — Progress Notes (Signed)
 FOLLOW UP Date of Service/Encounter:  01/22/23   Subjective:  Whitney Chapman (DOB: March 27, 1945) is a 78 y.o. female who [facility] for follow up for skin testing.   History obtained from: chart review and patient.  Anti histamines held.   Past Medical History: Past Medical History:  Diagnosis Date   Anxiety    Cervical radiculopathy    Crohn disease (HCC)    Edema    GERD (gastroesophageal reflux disease)    Hiatal hernia    Hyperlipidemia    Hypertension     Objective:  There were no vitals taken for this visit. There is no height or weight on file to calculate BMI. Physical Exam: GEN: alert, well developed HEENT: clear conjunctiva, MMM LUNGS: unlabored respiration  Skin Testing:  Skin prick testing was placed, which includes aeroallergens/foods, histamine control, and saline control.  Verbal consent was obtained prior to placing test.  Patient tolerated procedure well.  Allergy  testing results were read and interpreted by myself, documented by clinical staff. Adequate positive and negative control.  Positive results to:  Results discussed with patient/family.  Airborne Adult Perc - 01/22/23 1044     Time Antigen Placed 1044    Allergen Manufacturer Jestine    Location Back    Number of Test 55    1. Control-Buffer 50% Glycerol Negative    2. Control-Histamine 3+    3. Bahia Negative    4. Bermuda Negative    5. Johnson Negative    6. Kentucky  Blue Negative    7. Meadow Fescue Negative    8. Perennial Rye Negative    9. Timothy Negative    10. Ragweed Mix Negative    11. Cocklebur Negative    12. Plantain,  English Negative    13. Baccharis Negative    14. Dog Fennel Negative    15. Russian Thistle Negative    16. Lamb's Quarters Negative    17. Sheep Sorrell Negative    18. Rough Pigweed Negative    19. Marsh Elder, Rough Negative    20. Mugwort, Common Negative    21. Box, Elder Negative    22. Cedar, red  Negative    23. Sweet Gum Negative    24. Pecan Pollen Negative    25. Pine Mix Negative    26. Walnut, Black Pollen Negative    27. Red Mulberry Negative    28. Ash Mix Negative    29. Birch Mix Negative    30. Beech American Negative    31. Cottonwood, Eastern Negative    32. Hickory, White Negative    33. Maple Mix Negative    34. Oak, Eastern Mix Negative    35. Sycamore Eastern Negative    36. Alternaria Alternata Negative    37. Cladosporium Herbarum Negative    38. Aspergillus Mix Negative    39. Penicillium Mix Negative    40. Bipolaris Sorokiniana (Helminthosporium) Negative    41. Drechslera Spicifera (Curvularia) Negative    42. Mucor Plumbeus Negative    43. Fusarium Moniliforme Negative    44. Aureobasidium Pullulans (pullulara) Negative    45. Rhizopus Oryzae Negative    46. Botrytis Cinera Negative    47. Epicoccum Nigrum Negative    48. Phoma Betae Negative    49. Dust Mite Mix 2+    50. Cat Hair 10,000 BAU/ml Negative    51.  Dog Epithelia Negative    52. Mixed Feathers Negative  53. Horse Epithelia Negative    54. Cockroach, German Negative    55. Tobacco Leaf Negative              Assessment:   1. Pruritus     Plan/Recommendations:  Pruritus - SPT 01/2023: positive to dust mite. Avoidance measures discussed.  - Please follow up thyroid studies with PCP in a few months; slightly low FT4 and high TSH but not enough to start thyroid medications. She also reports this has happened in the past but then her levels normalized.  - Do a daily soaking tub bath in warm water  for 10-15 minutes.  - Use a gentle, unscented cleanser at the end of the bath (such as Dove unscented bar or baby wash, or Aveeno sensitive body wash). Then rinse, pat half-way dry, and apply a gentle, unscented moisturizer cream or ointment (Cerave, Cetaphil, Eucerin, Aveeno, Aquaphor, Vanicream, Vaseline)  all over while still damp. Dry skin makes the itching and rash of eczema  worse. The skin should be moisturized with a gentle, unscented moisturizer at least twice daily.  - Use only unscented liquid laundry detergent. - Use Zyrtec  10mg  twice daily as needed.   Drug Reaction - There is no way to predict drug allergies.  There is unfortunately no testing available for many of the drugs (we only have testing for penicillin). If a specific medication is required, may need a referral to academic Allergy  center for testing.   - Reports hx of itching with multiple drugs. Discussed this is not consistent with an IgE mediated allergy  and therefore, it is a risk vs benefit discussion with PCP. Can use Zyrtec  10mg  daily to control itching, if a particular medication is necessary.   ALLERGEN AVOIDANCE MEASURES   Dust Mites Use central air conditioning and heat; and change the filter monthly.  Pleated filters work better than mesh filters.  Electrostatic filters may also be used; wash the filter monthly.  Window air conditioners may be used, but do not clean the air as well as a central air conditioner.  Change or wash the filter monthly. Keep windows closed.  Do not use attic fans.   Encase the mattress, box springs and pillows with zippered, dust proof covers. Wash the bed linens in hot water  weekly.   Remove carpet, especially from the bedroom. Remove stuffed animals, throw pillows, dust ruffles, heavy drapes and other items that collect dust from the bedroom. Do not use a humidifier.   Use wood, vinyl or leather furniture instead of cloth furniture in the bedroom. Keep the indoor humidity at 30 - 40%.      Return in about 2 months (around 03/22/2023).  Arleta Blanch, MD [facility] 

## 2023-01-22 NOTE — Patient Instructions (Addendum)
 Pruritus - SPT 01/2023: positive to dust mite  - Please follow up thyroid studies with PCP in a few months; slightly low FT4 and high TSH but not enough to start thyroid medications.  - Do a daily soaking tub bath in warm water  for 10-15 minutes.  - Use a gentle, unscented cleanser at the end of the bath (such as Dove unscented bar or baby wash, or Aveeno sensitive body wash). Then rinse, pat half-way dry, and apply a gentle, unscented moisturizer cream or ointment (Cerave, Cetaphil, Eucerin, Aveeno, Aquaphor, Vanicream, Vaseline)  all over while still damp. Dry skin makes the itching and rash of eczema worse. The skin should be moisturized with a gentle, unscented moisturizer at least twice daily.  - Use only unscented liquid laundry detergent.  Drug Reaction - There is no way to predict drug allergies.  There is unfortunately no testing available for many of the drugs (we only have testing for penicillin). If a specific medication is required, may need a referral to academic Allergy  center for testing.    ALLERGEN AVOIDANCE MEASURES   Dust Mites Use central air conditioning and heat; and change the filter monthly.  Pleated filters work better than mesh filters.  Electrostatic filters may also be used; wash the filter monthly.  Window air conditioners may be used, but do not clean the air as well as a central air conditioner.  Change or wash the filter monthly. Keep windows closed.  Do not use attic fans.   Encase the mattress, box springs and pillows with zippered, dust proof covers. Wash the bed linens in hot water  weekly.   Remove carpet, especially from the bedroom. Remove stuffed animals, throw pillows, dust ruffles, heavy drapes and other items that collect dust from the bedroom. Do not use a humidifier.   Use wood, vinyl or leather furniture instead of cloth furniture in the bedroom. Keep the indoor humidity at 30 - 40%.

## 2023-04-23 ENCOUNTER — Ambulatory Visit: Payer: Medicare Other | Admitting: Internal Medicine
